# Patient Record
Sex: Male | Born: 1990
Health system: Southern US, Community
[De-identification: ages and names within clinical notes are randomized; demographics above are authoritative.]

## PROBLEM LIST (undated history)

## (undated) ENCOUNTER — Emergency Department (HOSPITAL_COMMUNITY): Admission: EM | Payer: Self-pay | Source: Home / Self Care

## (undated) DIAGNOSIS — I498 Other specified cardiac arrhythmias: Secondary | ICD-10-CM

## (undated) DIAGNOSIS — R9431 Abnormal electrocardiogram [ECG] [EKG]: Secondary | ICD-10-CM

## (undated) DIAGNOSIS — L309 Dermatitis, unspecified: Secondary | ICD-10-CM

## (undated) DIAGNOSIS — I2699 Other pulmonary embolism without acute cor pulmonale: Secondary | ICD-10-CM

## (undated) HISTORY — DX: Dermatitis, unspecified: L30.9

## (undated) HISTORY — PX: LACERATION REPAIR: SHX5168

## (undated) HISTORY — DX: Other specified cardiac arrhythmias: I49.8

## (undated) HISTORY — DX: Abnormal electrocardiogram (ECG) (EKG): R94.31

## (undated) HISTORY — DX: Other pulmonary embolism without acute cor pulmonale: I26.99

---

## 2016-12-23 ENCOUNTER — Emergency Department (HOSPITAL_COMMUNITY)
Admission: EM | Admit: 2016-12-23 | Discharge: 2016-12-24 | Disposition: A | Discharge status: Home / Self Care | Payer: Commercial Managed Care - PPO | Attending: Emergency Medicine | Admitting: Emergency Medicine

## 2016-12-23 ENCOUNTER — Other Ambulatory Visit: Payer: Self-pay

## 2016-12-23 ENCOUNTER — Emergency Department (HOSPITAL_COMMUNITY): Payer: Commercial Managed Care - PPO

## 2016-12-23 ENCOUNTER — Encounter (HOSPITAL_COMMUNITY): Payer: Self-pay | Admitting: Emergency Medicine

## 2016-12-23 DIAGNOSIS — R002 Palpitations: Secondary | ICD-10-CM | POA: Diagnosis not present

## 2016-12-23 DIAGNOSIS — R42 Dizziness and giddiness: Secondary | ICD-10-CM | POA: Insufficient documentation

## 2016-12-23 DIAGNOSIS — R9431 Abnormal electrocardiogram [ECG] [EKG]: Secondary | ICD-10-CM | POA: Diagnosis not present

## 2016-12-23 LAB — I-STAT TROPONIN, ED: Troponin i, poc: 0.02 ng/mL (ref 0.00–0.08)

## 2016-12-23 NOTE — ED Triage Notes (Signed)
Pt presents to ED for assessment after having a 25 minute cardio work out tonight, and then drinking creatine (workout supplement) and states he began to feel nauseous, sweaty, having palpitations.  Patient states most symptoms have resolved now but the palpitations.

## 2016-12-23 NOTE — ED Triage Notes (Signed)
Patient previous chart cancelled by accident.  Pt presents to ED for assessment of weakness, fatigue, light-headedness, nausea, and diaphoresis after a 25 minute work out.  Pt states he drank creatase (workout supplement).  Pt denies CP or SOB;  EKG done and shown to Dr. Fayrene Fearing, irregular.  CP protocol blood added.

## 2016-12-24 ENCOUNTER — Other Ambulatory Visit: Payer: Self-pay

## 2016-12-24 LAB — BASIC METABOLIC PANEL
ANION GAP: 8 (ref 5–15)
BUN: 12 mg/dL (ref 6–20)
CHLORIDE: 105 mmol/L (ref 101–111)
CO2: 26 mmol/L (ref 22–32)
Calcium: 9.7 mg/dL (ref 8.9–10.3)
Creatinine, Ser: 1.22 mg/dL (ref 0.61–1.24)
GFR calc Af Amer: >60 mL/min (ref >60)
GFR calc non Af Amer: >60 mL/min (ref >60)
GLUCOSE: 100 mg/dL — AB (ref 65–99)
POTASSIUM: 3.5 mmol/L (ref 3.5–5.1)
Sodium: 139 mmol/L (ref 135–145)

## 2016-12-24 LAB — CBC
HEMATOCRIT: 43 % (ref 39.0–52.0)
HEMOGLOBIN: 15.1 g/dL (ref 13.0–17.0)
MCH: 29.6 pg (ref 26.0–34.0)
MCHC: 35.1 g/dL (ref 30.0–36.0)
MCV: 84.3 fL (ref 78.0–100.0)
Platelets: 231 10*3/uL (ref 150–400)
RBC: 5.1 MIL/uL (ref 4.22–5.81)
RDW: 12.7 % (ref 11.5–15.5)
WBC: 6.6 10*3/uL (ref 4.0–10.5)

## 2016-12-24 LAB — I-STAT TROPONIN, ED: Troponin i, poc: 0.02 ng/mL (ref 0.00–0.08)

## 2016-12-24 NOTE — ED Provider Notes (Signed)
I was signed out patient as pending repeat troponin for evaluation of Wellens appearing EKG.  3hr troponin is negative as well.  EKG is unchanged x3.  Plan to DC home with close follow up with Dr. Eldridge DaceVaranasi in clinic. I explained this to the patient and he demonstrates good understanding. He appears well and in NAD. VS remain within his normal limits and he is safe for DC.   Tomasita Crumbleni, Macil Crady, MD 12/24/16 [phone]

## 2016-12-24 NOTE — ED Provider Notes (Signed)
[facility]-EMERGENCY DEPT Provider Note   CSN: [id number] Arrival date & time: 12/23/16  2203     History   Chief Complaint Chief Complaint  Patient presents with  . Weakness    HPI Philip Houston is a 26 y.o. male. CC: Felt funny after workout.  HPI:  26 year old male. He did a workout today with his girlfriend. This was at the gym. Some cardio and some lifting. He denies any symptoms while doing this. He drank creatine shake afterwards. No stimulants. He states he felt funny. He felt like his heart was pounding. He felt dizzy and lightheaded and nauseated. He did not have chest pain. He did not have shortness of breath. He became concerned with his symptoms. They were not improving this is girlfriend drove him here. After checking into triage and having an EKG he decided to leave as he was feeling better.  His EKG was abnormal and he was asked by the triage nurse to first him outside to come back for evaluation.  History reviewed. No pertinent past medical history.  There are no active problems to display for this patient.   History reviewed. No pertinent surgical history.     Home Medications    Prior to Admission medications   Not on File    Family History History reviewed. No pertinent family history.  Social History Social History  Substance Use Topics  . Smoking status: Never Smoker  . Smokeless tobacco: Never Used  . Alcohol use Yes     Comment: socially     Allergies   Sulfa antibiotics   Review of Systems Review of Systems  Constitutional: Negative for appetite change, chills, diaphoresis, fatigue and fever.  HENT: Negative for mouth sores, sore throat and trouble swallowing.   Eyes: Negative for visual disturbance.  Respiratory: Negative for cough, chest tightness, shortness of breath and wheezing.   Cardiovascular: Negative for chest pain.  Gastrointestinal: Negative for abdominal distention, abdominal pain, diarrhea, nausea and vomiting.  Endocrine:  Negative for polydipsia, polyphagia and polyuria.  Genitourinary: Negative for dysuria, frequency and hematuria.  Musculoskeletal: Negative for gait problem.  Skin: Negative for color change, pallor and rash.  Neurological: Positive for dizziness, weakness and light-headedness. Negative for syncope and headaches.  Hematological: Does not bruise/bleed easily.  Psychiatric/Behavioral: Negative for behavioral problems and confusion.     Physical Exam Updated Vital Signs BP 124/90 (BP Location: Right Arm)   Pulse 78   Temp 99 F (37.2 C) (Oral)   Resp 16   SpO2 98%   Physical Exam  Constitutional: He is oriented to person, place, and time. He appears well-developed and well-nourished. No distress.  HENT:  Head: Normocephalic.  Eyes: Pupils are equal, round, and reactive to light. Conjunctivae are normal. No scleral icterus.  Neck: Normal range of motion. Neck supple. No thyromegaly present.  Cardiovascular: Normal rate and regular rhythm.  Exam reveals no gallop and no friction rub.   No murmur heard. Pulmonary/Chest: Effort normal and breath sounds normal. No respiratory distress. He has no wheezes. He has no rales.  Abdominal: Soft. Bowel sounds are normal. He exhibits no distension. There is no tenderness. There is no rebound.  Musculoskeletal: Normal range of motion.  Neurological: He is alert and oriented to person, place, and time.  Skin: Skin is warm and dry. No rash noted.  Psychiatric: He has a normal mood and affect. His behavior is normal.     ED Treatments / Results  Labs (all labs ordered are listed, but  only abnormal results are displayed) Labs Reviewed  CBC  BASIC METABOLIC PANEL  I-STAT TROPONIN, ED    EKG  EKG Interpretation None       Radiology Dg Chest 2 View  Result Date: 12/23/2016 CLINICAL DATA:  Weakness and fatigue.  Diaphoresis tonight. EXAM: CHEST  2 VIEW COMPARISON:  None. FINDINGS: The lungs are clear. The pulmonary vasculature is normal.  Heart size is normal. Hilar and mediastinal contours are unremarkable. There is no pleural effusion. IMPRESSION: No active cardiopulmonary disease. Electronically Signed   By: Ellery Plunk M.D.   On: 12/23/2016 23:23    Procedures Procedures (including critical care time)  Medications Ordered in ED Medications - No data to display   Initial Impression / Assessment and Plan / ED Course  I have reviewed the triage vital signs and the nursing notes.  Pertinent labs & imaging results that were available during my care of the patient were reviewed by me and considered in my medical decision making (see chart for details).   EKG shows ST elevations V1 and V2. Diffuse ST depressions. He is in a sinus based rhythm. Initial concern was for anterior MI, or Wellen's syndrome. However, the patient is asymptomatic. States he feels "fine". I discussed the case with Dr.Varanosi.  With my description of a young patient with no history of structural heart disease or cardiac risk factors or stimulant use and no symptoms he felt this was likely explained by interventricular conduction delay. He agreed with serial enzymes and cardiac follow-up if asymptomatic, and normal enzymes.  Final Clinical Impressions(s) / ED Diagnoses   Final diagnoses:  Dizzy    New Prescriptions New Prescriptions   No medications on file     Rolland Porter, MD 12/24/16 [phone]

## 2016-12-24 NOTE — ED Notes (Signed)
Pt ambulated to the bathroom without difficulty.  

## 2016-12-31 ENCOUNTER — Telehealth: Payer: Self-pay | Admitting: *Deleted

## 2016-12-31 NOTE — Telephone Encounter (Signed)
Left message for EP scheduler to arrange a EP appoint for pt.

## 2017-01-11 ENCOUNTER — Ambulatory Visit (INDEPENDENT_AMBULATORY_CARE_PROVIDER_SITE_OTHER): Payer: Commercial Managed Care - PPO | Admitting: Internal Medicine

## 2017-01-11 ENCOUNTER — Encounter: Payer: Self-pay | Admitting: Internal Medicine

## 2017-01-11 VITALS — BP 126/84 | HR 74 | Ht 71.0 in | Wt 197.8 lb

## 2017-01-11 DIAGNOSIS — I498 Other specified cardiac arrhythmias: Secondary | ICD-10-CM | POA: Diagnosis not present

## 2017-01-11 NOTE — Patient Instructions (Signed)
Medication Instructions: - Your physician recommends that you continue on your current medications as directed. Please refer to the Current Medication list given to you today.  Labwork: - none ordered  Procedures/Testing: - Your physician has requested that you have an echocardiogram. Echocardiography is a painless test that uses sound waves to create images of your heart. It provides your doctor with information about the size and shape of your heart and how well your heart's chambers and valves are working. This procedure takes approximately one hour. There are no restrictions for this procedure.  Follow-Up: - Your physician wants you to follow-up in: 6 months with Dr. Graciela Husbands. You will receive a reminder letter in the mail two months in advance. If you don't receive a letter, please call our office to schedule the follow-up appointment.  Any Additional Special Instructions Will Be Listed Below (If Applicable). - Alive Cor monitor    If you need a refill on your cardiac medications before your next appointment, please call your pharmacy.

## 2017-01-11 NOTE — Progress Notes (Signed)
ELECTROPHYSIOLOGY CONSULT NOTE  Patient ID: Philip Houston, MRN: [id number], DOB/AGE: 28/06/1990 28 y.o. Admit date: (Not on file) Date of Consult: 01/11/2017  Primary Physician: Patient, No Pcp Per Primary Cardiologist: new     Philip Houston is a 26 y.o. male who is being seen today for the evaluation of abnormal ECG at the request of ER.    HPI Philip Houston is a 26 y.o. male referred for abnormal ECG  He was working out and afterwards, he took Creatine and developed palps and got LH.  He went to ER where ECG Brugada Type 1-- He continued to have palps, hard but not fast. They have resovled, having lasted about 4 days  No hx of syncope; no family hx of syncope.  A paternal uncle died unexpectedly at about 51 yo.   Past Medical History:  Diagnosis Date  . Abnormal ECG    Brugada Type 1 pattern      Surgical History: No past surgical history on file.   Home Meds: Prior to Admission medications   Not on File    Allergies:  Allergies  Allergen Reactions  . Sulfa Antibiotics     "when I was a kid"    Social History   Social History  . Marital status: Married    Spouse name: N/A  . Number of children: N/A  . Years of education: N/A   Occupational History  . Not on file.   Social History Main Topics  . Smoking status: Never Smoker  . Smokeless tobacco: Never Used  . Alcohol use Yes     Comment: socially  . Drug use: No  . Sexual activity: Not on file   Other Topics Concern  . Not on file   Social History Narrative  . No narrative on file     No family history on file.   ROS:  Please see the history of present illness.     All other systems reviewed and negative.    Physical Exam: Blood pressure 126/84, pulse 74, height  (1.803 m), weight 197 lb 12.8 oz (89.7 kg), SpO2 97 %. General: Well developed, well nourished male in no acute distress. Head: Normocephalic, atraumatic, sclera non-icteric, no xanthomas, nares are without discharge. EENT:  normal  Lymph Nodes:  none Neck: Negative for carotid bruits. JVD not elevated. Back:without scoliosis kyphosis Lungs: Clear bilaterally to auscultation without wheezes, rales, or rhonchi. Breathing is unlabored. Heart: RRR with S1 S2. No  murmur . No rubs, or gallops appreciated. Abdomen: Soft, non-tender, non-distended with normoactive bowel sounds. No hepatomegaly. No rebound/guarding. No obvious abdominal masses. Msk:  Strength and tone appear normal for age. Extremities: No clubbing or cyanosis. No edema.  Distal pedal pulses are 2+ and equal bilaterally. Skin: Warm and Dry Neuro: Alert and oriented X 3. CN III-XII intact Grossly normal sensory and motor function . Psych:  Responds to questions appropriately with a normal affect.      Labs: Cardiac Enzymes No results for input(s): CKTOTAL, CKMB, TROPONINI in the last 72 hours. CBC Lab Results  Component Value Date   WBC 6.6 12/23/2016   HGB 15.1 12/23/2016   HCT 43.0 12/23/2016   MCV 84.3 12/23/2016   PLT 231 12/23/2016   PROTIME: No results for input(s): LABPROT, INR in the last 72 hours. Chemistry No results for input(s): NA, K, CL, CO2, BUN, CREATININE, CALCIUM, PROT, BILITOT, ALKPHOS, ALT, AST, GLUCOSE in the last 168 hours.  Invalid input(s): LABALBU Lipids No  results found for: CHOL, HDL, LDLCALC, TRIG BNP No results found for: PROBNP Thyroid Function Tests: No results for input(s): TSH, T4TOTAL, T3FREE, THYROIDAB in the last 72 hours.  Invalid input(s): FREET3 Miscellaneous No results found for: DDIMER  Radiology/Studies:  Dg Chest 2 View  Result Date: 12/23/2016 CLINICAL DATA:  Weakness and fatigue.  Diaphoresis tonight. EXAM: CHEST  2 VIEW COMPARISON:  None. FINDINGS: The lungs are clear. The pulmonary vasculature is normal. Heart size is normal. Hilar and mediastinal contours are unremarkable. There is no pleural effusion. IMPRESSION: No active cardiopulmonary disease. Electronically Signed   By: Ellery Plunk M.D.   On: 12/23/2016 23:23    EKG: Sinus at 61 with a type I Brugada ECG   Assessment and Plan:  Brugada ECG  Palpitations   The patient has an asymptomatic Brugada ECG without serious symptoms i.e. Syncope or aborted cardiac arrest. There is no clear role for risk stratification in this patient cohort.  Imaging may be of value to identify phenocopies i.e. ARVC. We will undertake an echocardiogram   With his palpitations, if we were to find ventricular tachycardia further testing might be indicated. Hence I have recommended the use of an AliveCor monitor   I've also given recommendations regarding antipyretic therapy       Sherryl Manges

## 2017-01-20 ENCOUNTER — Ambulatory Visit: Payer: Commercial Managed Care - PPO | Admitting: Physician Assistant

## 2017-01-25 ENCOUNTER — Other Ambulatory Visit: Payer: Self-pay

## 2017-01-25 ENCOUNTER — Telehealth (HOSPITAL_COMMUNITY): Payer: Self-pay | Admitting: Internal Medicine

## 2017-01-25 ENCOUNTER — Ambulatory Visit (HOSPITAL_COMMUNITY): Payer: Commercial Managed Care - PPO | Attending: Internal Medicine

## 2017-01-25 DIAGNOSIS — I498 Other specified cardiac arrhythmias: Secondary | ICD-10-CM | POA: Diagnosis not present

## 2017-01-25 DIAGNOSIS — I420 Dilated cardiomyopathy: Secondary | ICD-10-CM | POA: Diagnosis not present

## 2017-01-25 DIAGNOSIS — I051 Rheumatic mitral insufficiency: Secondary | ICD-10-CM | POA: Insufficient documentation

## 2017-01-28 NOTE — Telephone Encounter (Signed)
User: Trina Ao A Date/time: 01/25/17 2:27 PM  Comment: Called pt and lmsg for him to CB to see if he wanted to come in sooner.   Context:  Outcome: Left Message  Phone number: [phone] Phone Type: Home Phone  Comm. type: Telephone Call type: Outgoing  Contact: Roena Malady Relation to patient: Self

## 2017-02-04 ENCOUNTER — Ambulatory Visit: Payer: Commercial Managed Care - PPO | Admitting: Interventional Cardiology

## 2017-02-08 ENCOUNTER — Telehealth: Payer: Self-pay

## 2017-02-08 NOTE — Telephone Encounter (Signed)
lmtcb for echo results.

## 2017-02-11 NOTE — Telephone Encounter (Signed)
Pt is aware and agreeable to normal echo results 

## 2018-07-18 ENCOUNTER — Telehealth: Payer: Self-pay | Admitting: Internal Medicine

## 2018-07-18 NOTE — Telephone Encounter (Signed)
  Patient needs to be cleared by his cardiologist to start his new job in [employer]. He is supposed to start August 15, 2018. Patient advised we were not making any new appts before August 08, 2018. Our first available appt was 08/16/18. Patient would like to see if he can be worked in for clearance.

## 2018-07-18 NOTE — Telephone Encounter (Signed)
Pt has agreed to have a telehealth visit with Dr Graciela Husbands on 3/25.

## 2018-07-18 NOTE — Telephone Encounter (Signed)
Scheduler informed pt may do a telehealth visit, otherwise he will need to wait until we have passed COVID-19.

## 2018-07-18 NOTE — Telephone Encounter (Signed)
New Message    Patient has appointment on 4/16 but states he starts his new job 4/20 so he would like to see if he could come in sooner.

## 2018-07-19 ENCOUNTER — Telehealth: Payer: Self-pay | Admitting: Internal Medicine

## 2018-07-19 ENCOUNTER — Encounter: Payer: Self-pay | Admitting: Internal Medicine

## 2018-07-19 NOTE — Telephone Encounter (Signed)
Clearance to work form placed in Dr. Odessa Fleming box for signature.

## 2018-07-20 ENCOUNTER — Other Ambulatory Visit: Payer: Self-pay

## 2018-07-20 ENCOUNTER — Telehealth (INDEPENDENT_AMBULATORY_CARE_PROVIDER_SITE_OTHER): Payer: Commercial Managed Care - PPO | Admitting: Internal Medicine

## 2018-07-20 VITALS — BP 130/84 | HR 58 | Ht 71.0 in | Wt 200.0 lb

## 2018-07-20 DIAGNOSIS — I498 Other specified cardiac arrhythmias: Secondary | ICD-10-CM | POA: Diagnosis not present

## 2018-07-20 NOTE — Progress Notes (Signed)
Virtual Visit via Video Note    Evaluation Performed:  Follow-up visit  This visit type was conducted due to national recommendations for restrictions regarding the COVID-19 Pandemic (e.g. social distancing).  This format is felt to be most appropriate for this patient at this time.  All issues noted in this document were discussed and addressed.  No physical exam was performed (except for noted visual exam findings with Video Visits).  Please refer to the patient's chart (MyChart message for video visits and phone note for telephone visits) for the patient's consent to telehealth for [facility].  Date:  07/20/2018   ID:  Philip Houston, DOB 1990-08-10, MRN [id number]  Patient Location:   [address] [place] [address]   Provider location:   [place]  PCP:  Patient, No Pcp Per  Cardiologist:  Graciela Husbands Electrophysiologist:  None   Chief Complaint:  Work release  History of Present Illness:    Philip Houston is a 28 y.o. male who presents via audio/video conferencing for a telehealth visit today.   He has a hx of significant lightheadedness and presyncope but no syncope  ER>> ECG  T1 Brugada tracing  .The patient denies chest pain, shortness of breath, nocturnal dyspnea, orthopnea or peripheral edema.  There have been no palpitations, lightheadedness or syncope.    The patient does not symptoms concerning for COVID-19 infection (fever, chills, cough, or new SHORTNESS OF BREATH).    Prior CV studies:   The following studies were reviewed today:  ECG  Past Medical History:  Diagnosis Date  . Abnormal ECG    Brugada Type 1 pattern   No past surgical history on file.   Current Meds  Medication Sig  . Multiple Vitamins-Minerals (MULTIVITAMIN ADULT) CHEW Chew by mouth.     Allergies:   Sulfa antibiotics   Social History   Tobacco Use  . Smoking status: Never Smoker  . Smokeless tobacco: Never Used  Substance Use Topics  . Alcohol use: Yes    Comment:  socially  . Drug use: No     Family Hx: The patient's family history is not on file.  ROS:   Please see the history of present illness.      All other systems reviewed and are negative.   Labs/Other Tests and Data Reviewed:    Recent Labs: No results found for requested labs within last 8760 hours.   Recent Lipid Panel No results found for: CHOL, TRIG, HDL, CHOLHDL, LDLCALC, LDLDIRECT  Wt Readings from Last 3 Encounters:  07/20/18 200 lb (90.7 kg)  01/11/17 197 lb 12.8 oz (89.7 kg)     Exam:    Vital Signs:  BP 130/84   Pulse (!) 58   Ht 5\' 11"  (1.803 m)   Wt 200 lb (90.7 kg)   BMI 27.89 kg/m    Well nourished, well developed male in no  acute distress.    ASSESSMENT & PLAN:    1.  BRUGADA T 1 ECG  The patient has a T1 Brugada ECG in the absence of symptoms to support a diagnosis of Brugada syndrome.  Hence recommendations are to be attentive to fever--with generous use of antipyretics and avoid other meds as listed at [facility] Or[facility]  No other restrictions are necessary  His form clearing him for full duty was signed     COVID-19 Education: The signs and symptoms of COVID-19 were discussed with the patient and how to seek care for testing (follow up with PCP or  arrange E-visit).   The importance of social distancing was discussed today.  Patient Risk:   After full review of this patients clinical status, I feel that they are at least moderate risk at this time.  Time:   Today, I have spent 18 minutes with the patient with telehealth technology discussing As above     Medication Adjustments/Labs and Tests Ordered: Current medicines are reviewed at length with the patient today.  Concerns regarding medicines are outlined above.  Tests Ordered: No orders of the defined types were placed in this encounter.  Medication Changes: No orders of the defined types were placed in this encounter.   Disposition: prn Signed, Sherryl Manges, MD  07/20/2018 1:01  PM    [facility]

## 2018-07-20 NOTE — Patient Instructions (Addendum)
Medication Instructions:  Your physician recommends that you continue on your current medications as directed. Please refer to the Current Medication list given to you today.  Labwork: None ordered.  Testing/Procedures: None ordered.  Follow-Up: Your physician recommends that you schedule a follow-up appointment as needed with Dr Klein  Any Other Special Instructions Will Be Listed Below (If Applicable).     If you need a refill on your cardiac medications before your next appointment, please call your pharmacy.  

## 2018-08-11 ENCOUNTER — Ambulatory Visit: Payer: Commercial Managed Care - PPO | Admitting: Internal Medicine

## 2018-10-13 ENCOUNTER — Ambulatory Visit: Payer: Self-pay | Admitting: Physician Assistant

## 2018-11-17 ENCOUNTER — Ambulatory Visit: Payer: Self-pay | Admitting: Physician Assistant

## 2019-01-26 ENCOUNTER — Encounter: Payer: Self-pay | Admitting: Internal Medicine

## 2019-01-26 ENCOUNTER — Ambulatory Visit (INDEPENDENT_AMBULATORY_CARE_PROVIDER_SITE_OTHER): Payer: BC Managed Care – PPO | Admitting: Internal Medicine

## 2019-01-26 ENCOUNTER — Other Ambulatory Visit: Payer: Self-pay

## 2019-01-26 VITALS — Ht 71.0 in | Wt 195.0 lb

## 2019-01-26 DIAGNOSIS — E559 Vitamin D deficiency, unspecified: Secondary | ICD-10-CM

## 2019-01-26 DIAGNOSIS — Z0000 Encounter for general adult medical examination without abnormal findings: Secondary | ICD-10-CM | POA: Insufficient documentation

## 2019-01-26 DIAGNOSIS — I498 Other specified cardiac arrhythmias: Secondary | ICD-10-CM | POA: Diagnosis not present

## 2019-01-26 DIAGNOSIS — Z13220 Encounter for screening for lipoid disorders: Secondary | ICD-10-CM | POA: Diagnosis not present

## 2019-01-26 DIAGNOSIS — Z1329 Encounter for screening for other suspected endocrine disorder: Secondary | ICD-10-CM

## 2019-01-26 DIAGNOSIS — Z1389 Encounter for screening for other disorder: Secondary | ICD-10-CM

## 2019-01-26 DIAGNOSIS — R9431 Abnormal electrocardiogram [ECG] [EKG]: Secondary | ICD-10-CM | POA: Diagnosis not present

## 2019-01-26 DIAGNOSIS — L309 Dermatitis, unspecified: Secondary | ICD-10-CM

## 2019-01-26 MED ORDER — TRIAMCINOLONE ACETONIDE 0.1 % EX OINT: 1.0000 "application " | TOPICAL_OINTMENT | Freq: Two times a day (BID) | CUTANEOUS | 12 refills | Status: DC

## 2019-01-26 NOTE — Progress Notes (Signed)
Virtual Visit via Video Note  I connected with Philip Houston   on 01/26/19 at  9:10 AM EDT by a video enabled telemedicine application and verified that I am speaking with the correct person using two identifiers.  Location patient: home Location provider:work or home office Persons participating in the virtual visit: patient, provider  I discussed the limitations of evaluation and management by telemedicine and the availability of in person appointments. The patient expressed understanding and agreed to proceed.   HPI: 1. Annual needs PCP has never seen one no issues other than eczema left inner ankle x years and left index finger   2. H/o brugada denies lightheadedness, CP, sob, irregular heart beat or flutter but this is how it presented in 01/2017    ROS: See pertinent positives and negatives per HPI. General: wt stable HEENT: no sore throat  CV: no chest pain  Lungs: no sob  GI: no ab pain  GU: no issues MSK: no issues Neuro: no h/a  Skin: +eczema  Psych: no anxiety/depression   Past Medical History:  Diagnosis Date  . Abnormal ECG    Brugada Type 1 pattern  . Brugada syndrome    01/2017 presented after working out with lightheadedness, heart flutter and irregular HB  . Eczema     Past Surgical History:  Procedure Laterality Date  . LACERATION REPAIR     right thumb 28 y.o     Family History  Problem Relation Age of Onset  . Diabetes Father     SOCIAL HX:  Married  Emergency planning/management officer     Current Outpatient Medications:  Marland Kitchen  Multiple Vitamins-Minerals (MULTIVITAMIN ADULT) CHEW, Chew by mouth., Disp: , Rfl:  .  triamcinolone ointment (KENALOG) 0.1 %, Apply 1 application topically 2 (two) times daily. Prn eczema left ankle and finger, Disp: 80 g, Rfl: 12  EXAM:  VITALS per patient if applicable:  GENERAL: alert, oriented, appears well and in no acute distress  HEENT: atraumatic, conjunttiva clear, no obvious abnormalities on inspection of external nose and  ears  NECK: normal movements of the head and neck  LUNGS: on inspection no signs of respiratory distress, breathing rate appears normal, no obvious gross SOB, gasping or wheezing  CV: no obvious cyanosis  MS: moves all visible extremities without noticeable abnormality  PSYCH/NEURO: pleasant and cooperative, no obvious depression or anxiety, speech and thought processing grossly intact  ASSESSMENT AND PLAN:  Discussed the following assessment and plan:  Annual physical exam -  sch fasting labs [facility] 02/03/19  Flu shot declines  Tdap per pt had in 2019  Hep B immune per pt   rec healthy diet and exercise   Eczema left inner ankle and L index finger, unspecified type - Plan: triamcinolone ointment (KENALOG) 0.1 % Rec dove soap and moisturize using coconut oil   Brugada syndrome -saw cards 06/2018 at least f/u 1x per year Dr. Graciela Husbands   -we discussed possible serious and likely etiologies, options for evaluation and workup, limitations of telemedicine visit vs in person visit, treatment, treatment risks and precautions. Pt prefers to treat via telemedicine empirically rather then risking or undertaking an in person visit at this moment. Patient agrees to seek prompt in person care if worsening, new symptoms arise, or if is not improving with treatment.   I discussed the assessment and treatment plan with the patient. The patient was provided an opportunity to ask questions and all were answered. The patient agreed with the plan and demonstrated an understanding  of the instructions.   The patient was advised to call back or seek an in-person evaluation if the symptoms worsen or if the condition fails to improve as anticipated.  Time spent 30 minutes  Delorise Jackson, MD

## 2019-01-26 NOTE — Patient Instructions (Signed)
[facility] [address] or [address]  Fasting labs 02/03/2019 no food only water x 8-12 hours   Exercising to Lose Weight Exercise is structured, repetitive physical activity to improve fitness and health. Getting regular exercise is important for everyone. It is especially important if you are overweight. Being overweight increases your risk of heart disease, stroke, diabetes, high blood pressure, and several types of cancer. Reducing your calorie intake and exercising can help you lose weight. Exercise is usually categorized as moderate or vigorous intensity. To lose weight, most people need to do a certain amount of moderate-intensity or vigorous-intensity exercise each week. Moderate-intensity exercise  Moderate-intensity exercise is any activity that gets you moving enough to burn at least three times more energy (calories) than if you were sitting. Examples of moderate exercise include:  Walking a mile in 15 minutes.  Doing light yard work.  Biking at an easy pace. Most people should get at least 150 minutes (2 hours and 30 minutes) a week of moderate-intensity exercise to maintain their body weight. Vigorous-intensity exercise Vigorous-intensity exercise is any activity that gets you moving enough to burn at least six times more calories than if you were sitting. When you exercise at this intensity, you should be working hard enough that you are not able to carry on a conversation. Examples of vigorous exercise include:  Running.  Playing a team sport, such as football, basketball, and soccer.  Jumping rope. Most people should get at least 75 minutes (1 hour and 15 minutes) a week of vigorous-intensity exercise to maintain their body weight. How can exercise affect me? When you exercise enough to burn more calories than you eat, you lose weight. Exercise also reduces body fat and builds muscle. The more muscle you have, the more calories you burn. Exercise also:   Improves mood.  Reduces stress and tension.  Improves your overall fitness, flexibility, and endurance.  Increases bone strength. The amount of exercise you need to lose weight depends on:  Your age.  The type of exercise.  Any health conditions you have.  Your overall physical ability. Talk to your health care provider about how much exercise you need and what types of activities are safe for you. What actions can I take to lose weight? Nutrition   Make changes to your diet as told by your health care provider or diet and nutrition specialist (dietitian). This may include: ? Eating fewer calories. ? Eating more protein. ? Eating less unhealthy fats. ? Eating a diet that includes fresh fruits and vegetables, whole grains, low-fat dairy products, and lean protein. ? Avoiding foods with added fat, salt, and sugar.  Drink plenty of water while you exercise to prevent dehydration or heat stroke. Activity  Choose an activity that you enjoy and set realistic goals. Your health care provider can help you make an exercise plan that works for you.  Exercise at a moderate or vigorous intensity most days of the week. ? The intensity of exercise may vary from person to person. You can tell how intense a workout is for you by paying attention to your breathing and heartbeat. Most people will notice their breathing and heartbeat get faster with more intense exercise.  Do resistance training twice each week, such as: ? Push-ups. ? Sit-ups. ? Lifting weights. ? Using resistance bands.  Getting short amounts of exercise can be just as helpful as long structured periods of exercise. If you have trouble finding time to exercise, try to include  exercise in your daily routine. ? Get up, stretch, and walk around every 30 minutes throughout the day. ? Go for a walk during your lunch break. ? Park your car farther away from your destination. ? If you take public transportation, get off one stop  early and walk the rest of the way. ? Make phone calls while standing up and walking around. ? Take the stairs instead of elevators or escalators.  Wear comfortable clothes and shoes with good support.  Do not exercise so much that you hurt yourself, feel dizzy, or get very short of breath. Where to find more information  U.S. Department of Health and Human Services: [employer].at  Centers for Disease Control and Prevention (CDC): http://www.wolf.info/ Contact a health care provider:  Before starting a new exercise program.  If you have questions or concerns about your weight.  If you have a medical problem that keeps you from exercising. Get help right away if you have any of the following while exercising:  Injury.  Dizziness.  Difficulty breathing or shortness of breath that does not go away when you stop exercising.  Chest pain.  Rapid heartbeat. Summary  Being overweight increases your risk of heart disease, stroke, diabetes, high blood pressure, and several types of cancer.  Losing weight happens when you burn more calories than you eat.  Reducing the amount of calories you eat in addition to getting regular moderate or vigorous exercise each week helps you lose weight. This information is not intended to replace advice given to you by your health care provider. Make sure you discuss any questions you have with your health care provider. Document Released: 05/16/2010 Document Revised: 04/26/2017 Document Reviewed: 04/26/2017 Elsevier Patient Education  2020 [employer].

## 2019-09-18 ENCOUNTER — Encounter: Payer: Self-pay | Admitting: Emergency Medicine

## 2019-09-18 ENCOUNTER — Other Ambulatory Visit: Payer: Self-pay

## 2019-09-18 ENCOUNTER — Emergency Department: Payer: BC Managed Care – PPO

## 2019-09-18 ENCOUNTER — Inpatient Hospital Stay
Admission: EM | Admit: 2019-09-18 | Discharge: 2019-09-22 | DRG: 871 | Disposition: A | Discharge status: Home / Self Care | Payer: BC Managed Care – PPO | Attending: Internal Medicine | Admitting: Internal Medicine

## 2019-09-18 DIAGNOSIS — E872 Acidosis: Secondary | ICD-10-CM | POA: Diagnosis present

## 2019-09-18 DIAGNOSIS — R600 Localized edema: Secondary | ICD-10-CM | POA: Diagnosis not present

## 2019-09-18 DIAGNOSIS — Z20822 Contact with and (suspected) exposure to covid-19: Secondary | ICD-10-CM | POA: Diagnosis present

## 2019-09-18 DIAGNOSIS — I2601 Septic pulmonary embolism with acute cor pulmonale: Secondary | ICD-10-CM | POA: Diagnosis not present

## 2019-09-18 DIAGNOSIS — I498 Other specified cardiac arrhythmias: Secondary | ICD-10-CM | POA: Diagnosis not present

## 2019-09-18 DIAGNOSIS — J90 Pleural effusion, not elsewhere classified: Secondary | ICD-10-CM | POA: Diagnosis present

## 2019-09-18 DIAGNOSIS — A419 Sepsis, unspecified organism: Principal | ICD-10-CM | POA: Diagnosis present

## 2019-09-18 DIAGNOSIS — Z7901 Long term (current) use of anticoagulants: Secondary | ICD-10-CM | POA: Diagnosis not present

## 2019-09-18 DIAGNOSIS — R0602 Shortness of breath: Secondary | ICD-10-CM | POA: Diagnosis not present

## 2019-09-18 DIAGNOSIS — R079 Chest pain, unspecified: Secondary | ICD-10-CM | POA: Diagnosis not present

## 2019-09-18 DIAGNOSIS — I2690 Septic pulmonary embolism without acute cor pulmonale: Secondary | ICD-10-CM | POA: Diagnosis not present

## 2019-09-18 DIAGNOSIS — I2693 Single subsegmental pulmonary embolism without acute cor pulmonale: Secondary | ICD-10-CM | POA: Diagnosis present

## 2019-09-18 DIAGNOSIS — J189 Pneumonia, unspecified organism: Secondary | ICD-10-CM | POA: Diagnosis present

## 2019-09-18 DIAGNOSIS — I2699 Other pulmonary embolism without acute cor pulmonale: Secondary | ICD-10-CM

## 2019-09-18 DIAGNOSIS — I2602 Saddle embolus of pulmonary artery with acute cor pulmonale: Secondary | ICD-10-CM | POA: Diagnosis not present

## 2019-09-18 LAB — CBC
HCT: 45 % (ref 39.0–52.0)
Hemoglobin: 15.9 g/dL (ref 13.0–17.0)
MCH: 29.6 pg (ref 26.0–34.0)
MCHC: 35.3 g/dL (ref 30.0–36.0)
MCV: 83.6 fL (ref 80.0–100.0)
Platelets: 313 10*3/uL (ref 150–400)
RBC: 5.38 MIL/uL (ref 4.22–5.81)
RDW: 12.2 % (ref 11.5–15.5)
WBC: 12.3 10*3/uL — ABNORMAL HIGH (ref 4.0–10.5)
nRBC: 0 % (ref 0.0–0.2)

## 2019-09-18 LAB — COMPREHENSIVE METABOLIC PANEL
ALT: 45 U/L — ABNORMAL HIGH (ref 0–44)
AST: 23 U/L (ref 15–41)
Albumin: 4.9 g/dL (ref 3.5–5.0)
Alkaline Phosphatase: 84 U/L (ref 38–126)
Anion gap: 9 (ref 5–15)
BUN: 10 mg/dL (ref 6–20)
CO2: 26 mmol/L (ref 22–32)
Calcium: 9.4 mg/dL (ref 8.9–10.3)
Chloride: 103 mmol/L (ref 98–111)
Creatinine, Ser: 1.08 mg/dL (ref 0.61–1.24)
GFR calc Af Amer: >60 mL/min (ref >60)
GFR calc non Af Amer: >60 mL/min (ref >60)
Glucose, Bld: 151 mg/dL — ABNORMAL HIGH (ref 70–99)
Potassium: 3.7 mmol/L (ref 3.5–5.1)
Sodium: 138 mmol/L (ref 135–145)
Total Bilirubin: 1.3 mg/dL — ABNORMAL HIGH (ref 0.3–1.2)
Total Protein: 8.6 g/dL — ABNORMAL HIGH (ref 6.5–8.1)

## 2019-09-18 LAB — STREP PNEUMONIAE URINARY ANTIGEN: Strep Pneumo Urinary Antigen: NEGATIVE

## 2019-09-18 LAB — LACTIC ACID, PLASMA
Lactic Acid, Venous: 1 mmol/L (ref 0.5–1.9)
Lactic Acid, Venous: 2.3 mmol/L (ref 0.5–1.9)

## 2019-09-18 LAB — HIV ANTIBODY (ROUTINE TESTING W REFLEX): HIV Screen 4th Generation wRfx: NONREACTIVE

## 2019-09-18 LAB — SARS CORONAVIRUS 2 BY RT PCR (HOSPITAL ORDER, PERFORMED IN ~~LOC~~ HOSPITAL LAB): SARS Coronavirus 2: NEGATIVE

## 2019-09-18 LAB — MAGNESIUM: Magnesium: 2.1 mg/dL (ref 1.7–2.4)

## 2019-09-18 LAB — TROPONIN I (HIGH SENSITIVITY)
Troponin I (High Sensitivity): <2 ng/L (ref <18)
Troponin I (High Sensitivity): 3 ng/L (ref <18)

## 2019-09-18 LAB — POC SARS CORONAVIRUS 2 AG: SARS Coronavirus 2 Ag: NEGATIVE

## 2019-09-18 MED ORDER — SODIUM CHLORIDE 0.9 % IV SOLN
500.0000 mg | Freq: Once | INTRAVENOUS | Status: AC
  Administered 2019-09-18: 500 mg via INTRAVENOUS
  Filled 2019-09-18: qty 500

## 2019-09-18 MED ORDER — SODIUM CHLORIDE 0.9 % IV SOLN: INTRAVENOUS | Status: DC

## 2019-09-18 MED ORDER — DM-GUAIFENESIN ER 30-600 MG PO TB12
1.0000 | ORAL_TABLET | Freq: Two times a day (BID) | ORAL | Status: DC
  Administered 2019-09-18 – 2019-09-22 (×9): 1 via ORAL
  Filled 2019-09-18 (×9): qty 1

## 2019-09-18 MED ORDER — SODIUM CHLORIDE 0.9 % IV BOLUS
1500.0000 mL | Freq: Once | INTRAVENOUS | Status: AC
  Administered 2019-09-18: 500 mL via INTRAVENOUS

## 2019-09-18 MED ORDER — SODIUM CHLORIDE 0.9 % IV SOLN
1.0000 g | Freq: Once | INTRAVENOUS | Status: AC
  Administered 2019-09-18: 1 g via INTRAVENOUS
  Filled 2019-09-18: qty 10

## 2019-09-18 MED ORDER — SODIUM CHLORIDE 0.9 % IV SOLN
1.0000 g | INTRAVENOUS | Status: DC
  Administered 2019-09-19 – 2019-09-21 (×3): 1 g via INTRAVENOUS
  Filled 2019-09-18: qty 1
  Filled 2019-09-18: qty 10
  Filled 2019-09-18: qty 1
  Filled 2019-09-18: qty 10

## 2019-09-18 MED ORDER — ONDANSETRON HCL 4 MG/2ML IJ SOLN
4.0000 mg | Freq: Once | INTRAMUSCULAR | Status: AC
  Administered 2019-09-18: 4 mg via INTRAVENOUS
  Filled 2019-09-18: qty 2

## 2019-09-18 MED ORDER — OXYCODONE-ACETAMINOPHEN 5-325 MG PO TABS
1.0000 | ORAL_TABLET | ORAL | Status: DC | PRN
  Administered 2019-09-18 – 2019-09-21 (×5): 1 via ORAL
  Filled 2019-09-18 (×7): qty 1

## 2019-09-18 MED ORDER — MORPHINE SULFATE (PF) 2 MG/ML IV SOLN
2.0000 mg | INTRAVENOUS | Status: DC | PRN
  Administered 2019-09-18 – 2019-09-20 (×4): 2 mg via INTRAVENOUS
  Filled 2019-09-18 (×4): qty 1

## 2019-09-18 MED ORDER — ENOXAPARIN SODIUM 40 MG/0.4ML ~~LOC~~ SOLN
40.0000 mg | SUBCUTANEOUS | Status: DC
  Administered 2019-09-18 – 2019-09-19 (×2): 40 mg via SUBCUTANEOUS
  Filled 2019-09-18 (×2): qty 0.4

## 2019-09-18 MED ORDER — ACETAMINOPHEN 325 MG PO TABS
650.0000 mg | ORAL_TABLET | Freq: Four times a day (QID) | ORAL | Status: DC | PRN
  Administered 2019-09-18 – 2019-09-21 (×6): 650 mg via ORAL
  Filled 2019-09-18 (×7): qty 2

## 2019-09-18 MED ORDER — SODIUM CHLORIDE 0.9 % IV SOLN
100.0000 mg | Freq: Two times a day (BID) | INTRAVENOUS | Status: DC
  Administered 2019-09-19 – 2019-09-20 (×3): 100 mg via INTRAVENOUS
  Filled 2019-09-18 (×4): qty 100

## 2019-09-18 MED ORDER — MORPHINE SULFATE (PF) 4 MG/ML IV SOLN
4.0000 mg | Freq: Once | INTRAVENOUS | Status: AC
Start: 2019-09-18 — End: 2019-09-18
  Administered 2019-09-18: 4 mg via INTRAVENOUS
  Filled 2019-09-18: qty 1

## 2019-09-18 MED ORDER — SODIUM CHLORIDE 0.9 % IV SOLN
1000.0000 mL | Freq: Once | INTRAVENOUS | Status: AC
  Administered 2019-09-18: 1000 mL via INTRAVENOUS

## 2019-09-18 MED ORDER — ALBUTEROL SULFATE HFA 108 (90 BASE) MCG/ACT IN AERS
2.0000 | INHALATION_SPRAY | RESPIRATORY_TRACT | Status: DC | PRN
  Filled 2019-09-18: qty 6.7

## 2019-09-18 NOTE — H&P (Signed)
History and Physical    Philip Houston ZDG:[id number] DOB: 30-Apr-1990 DOA: 09/18/2019  Referring MD/NP/PA:   PCP: McLean-Scocuzza, Pasty Spillers, MD   Patient coming from:  The patient is coming from home.  At baseline, pt is independent for most of ADL.        Chief Complaint: SOB and chest pain  HPI: Philip Houston is a 29 y.o. male with medical history significant of Brugada syndrome type I, who presents with shortness of breath and chest pain  Patient symptoms started yesterday, including shortness of breath, cough, right-sided chest pain, fever and chills.  Patient had fever of 100.4 at home.  The chest pain is located in the right side of h is chest, pleuritic, sharp, moderate, nonradiating.  Patient does not have nausea vomiting, diarrhea, abdominal pain, symptoms of UTI or unilateral weakness.  ED Course: pt was found to have WBC 12.3, lactic acid 1.0, negative Covid Ag test, pending COVID-19 PCR, electrolytes renal function okay, magnesium 2.7, temperature 99.2, blood pressure 132/84, heart rate 93, RR 20, oxygen saturation 97% on room air.  Chest x-ray showed right base infiltration.  Patient is placed on progressive bed for observation.  Review of Systems:   General: has fevers, chills, no body weight gain, has fatigue HEENT: no blurry vision, hearing changes or sore throat Respiratory: has dyspnea, coughing, no wheezing CV: has chest pain, no palpitations GI: no nausea, vomiting, abdominal pain, diarrhea, constipation GU: no dysuria, burning on urination, increased urinary frequency, hematuria  Ext: no leg edema Neuro: no unilateral weakness, numbness, or tingling, no vision change or hearing loss Skin: no rash, no skin tear. MSK: No muscle spasm, no deformity, no limitation of range of movement in spin Heme: No easy bruising.  Travel history: No recent long distant travel.  Allergy:  Allergies  Allergen Reactions  . Sulfa Antibiotics     "when I was a kid" rash     Past  Medical History:  Diagnosis Date  . Abnormal ECG    Brugada Type 1 pattern  . Brugada syndrome    01/2017 presented after working out with lightheadedness, heart flutter and irregular HB  . Eczema     Past Surgical History:  Procedure Laterality Date  . LACERATION REPAIR     right thumb 29 y.o     Social History:  reports that he has never smoked. He has never used smokeless tobacco. He reports current alcohol use. He reports that he does not use drugs.  Family History:  Family History  Problem Relation Age of Onset  . Diabetes Father      Prior to Admission medications   Medication Sig Start Date End Date Taking? Authorizing Provider  Multiple Vitamins-Minerals (MULTIVITAMIN ADULT) CHEW Chew by mouth.    [provider]  triamcinolone ointment (KENALOG) 0.1 % Apply 1 application topically 2 (two) times daily. Prn eczema left ankle and finger 01/26/19   McLean-Scocuzza, Pasty Spillers, MD    Physical Exam: Vitals:   09/18/19 0900 09/18/19 0901 09/18/19 0902 09/18/19 0930  BP: 136/86   139/85  Pulse: 89 91 96 98  Resp: (!) 27 (!) 31 (!) 52 (!) 31  Temp:      TempSrc:      SpO2: 97% 98% 96% 97%  Weight:      Height:       General: Not in acute distress HEENT:       Eyes: PERRL, EOMI, no scleral icterus.       ENT: No  discharge from the ears and nose, no pharynx injection, no tonsillar enlargement.        Neck: No JVD, no bruit, no mass felt. Heme: No neck lymph node enlargement. Cardiac: S1/S2, RRR, No murmurs, No gallops or rubs. Respiratory:  No rales, wheezing, rhonchi or rubs. GI: Soft, nondistended, nontender, no rebound pain, no organomegaly, BS present. GU: No hematuria Ext: No pitting leg edema bilaterally. 2+DP/PT pulse bilaterally. Musculoskeletal: No joint deformities, No joint redness or warmth, no limitation of ROM in spin. Skin: No rashes.  Neuro: Alert, oriented X3, cranial nerves II-XII grossly intact, moves all extremities normally. Psych:  Patient is not psychotic, no suicidal or hemocidal ideation.  Labs on Admission: I have personally reviewed following labs and imaging studies  CBC: Recent Labs  Lab 09/18/19 0728  WBC 12.3*  HGB 15.9  HCT 45.0  MCV 83.6  PLT 774   Basic Metabolic Panel: Recent Labs  Lab 09/18/19 0728  NA 138  K 3.7  CL 103  CO2 26  GLUCOSE 151*  BUN 10  CREATININE 1.08  CALCIUM 9.4  MG 2.1   GFR: Estimated Creatinine Clearance: 104.3 mL/min (by C-G formula based on SCr of 1.08 mg/dL). Liver Function Tests: Recent Labs  Lab 09/18/19 0728  AST 23  ALT 45*  ALKPHOS 84  BILITOT 1.3*  PROT 8.6*  ALBUMIN 4.9   No results for input(s): LIPASE, AMYLASE in the last 168 hours. No results for input(s): AMMONIA in the last 168 hours. Coagulation Profile: No results for input(s): INR, PROTIME in the last 168 hours. Cardiac Enzymes: No results for input(s): CKTOTAL, CKMB, CKMBINDEX, TROPONINI in the last 168 hours. BNP (last 3 results) No results for input(s): PROBNP in the last 8760 hours. HbA1C: No results for input(s): HGBA1C in the last 72 hours. CBG: No results for input(s): GLUCAP in the last 168 hours. Lipid Profile: No results for input(s): CHOL, HDL, LDLCALC, TRIG, CHOLHDL, LDLDIRECT in the last 72 hours. Thyroid Function Tests: No results for input(s): TSH, T4TOTAL, FREET4, T3FREE, THYROIDAB in the last 72 hours. Anemia Panel: No results for input(s): VITAMINB12, FOLATE, FERRITIN, TIBC, IRON, RETICCTPCT in the last 72 hours. Urine analysis: No results found for: COLORURINE, APPEARANCEUR, LABSPEC, PHURINE, GLUCOSEU, HGBUR, BILIRUBINUR, KETONESUR, PROTEINUR, UROBILINOGEN, NITRITE, LEUKOCYTESUR Sepsis Labs: @LABRCNTIP (procalcitonin:4,lacticidven:4) )No results found for this or any previous visit (from the past 240 hour(s)).   Radiological Exams on Admission: DG Chest Portable 1 View  Result Date: 09/18/2019 CLINICAL DATA:  Shortness of breath and fever EXAM: PORTABLE  CHEST 1 VIEW COMPARISON:  December 23, 2016 FINDINGS: There is ill-defined airspace opacity in the right base with questionable small right pleural effusion. Lungs elsewhere clear. Heart size and pulmonary vascularity are normal. No adenopathy. No bone lesions. IMPRESSION: Apparent pneumonia right base with questionable small right pleural effusion. Lungs elsewhere clear. Cardiac silhouette within normal limits. No adenopathy. Electronically Signed   By: Lowella Grip III M.D.   On: 09/18/2019 07:55     EKG: Independently reviewed.  Brugada syndrome type I, LAD, QTC 447  Assessment/Plan Principal Problem:   CAP (community acquired pneumonia) Active Problems:   Brugada syndrome   CAP (community acquired pneumonia): Chest x-ray showed right base infiltration.  Lactic acid normal at 1.0.  Has leukocytosis 12.3.  Temperature 99.2.  Currently hemodynamically stable.  -Please to progressive unit for observation - IV Rocephin and doxycycline (patient received 1 dose of azithromycin in ED, will not continue azithromycin to avoid QT prolongation side effects) - Mucinex  for cough  - prn Albuterol  - Urine legionella and S. pneumococcal antigen - Follow up blood culture x2, sputum culture  - IVF: 2.5L of NS bolus in ED, followed by 75 mL per hour of NS   Hx of Brugada syndrome: pt is following up with cardiologist, Dr. Graciela Husbands -Cardiac monitor -Careful use of medications -avoid using QT prolonging meds now.     DVT ppx: sQ Lovenox Code Status: Full code Family Communication: Yes, patient's wife at bed side Disposition Plan:  Anticipate discharge back to previous environment Consults called:  none Admission status:   progressive unit for obs        Status is: Observation  The patient remains OBS appropriate and will d/c before 2 midnights.  Dispo: The patient is from: Home              Anticipated d/c is to: Home              Anticipated d/c date is: 1 day              Patient  currently is not medically stable to d/c.           Date of Service 09/18/2019    Lorretta Harp [facility]   If 7PM-7AM, please contact night-coverage [email or web address] 09/18/2019, 10:10 AM

## 2019-09-18 NOTE — ED Notes (Signed)
Rainbow drawn and sent to lab. °

## 2019-09-18 NOTE — ED Notes (Signed)
Pt and visitor given warm blanket per request.

## 2019-09-18 NOTE — Plan of Care (Signed)

## 2019-09-18 NOTE — ED Notes (Signed)
Pt sitting in bed speaking with this RN in NAD, visitor at bedside. PT states he is not having any pain at this time, advised pt to let me know if his pain returns. Visitor given water per request. Pt voices no needs at this time.

## 2019-09-18 NOTE — ED Notes (Signed)
Blood cultures collected and sent to lab prior to antibiotic start

## 2019-09-18 NOTE — ED Provider Notes (Signed)
[facility] Emergency Department Provider Note   ____________________________________________    I have reviewed the triage vital signs and the nursing notes.   HISTORY  Chief Complaint Shortness of Breath     HPI Philip Houston is a 29 y.o. male with a history of Brugada syndrome who presents with complaints of shortness of breath and right-sided chest pain. Patient describes yesterday he started having pain in his shoulder followed by pain in the right side of his chest and upper abdomen with worsening shortness of breath. Did have chills yesterday, temperature of 100.4. 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 Has never had this before. No calf pain or swelling. No history of DVT.   Past Medical History:  Diagnosis Date  . Abnormal ECG    Brugada Type 1 pattern  . Brugada syndrome    01/2017 presented after working out with lightheadedness, heart flutter and irregular HB  . Eczema     Patient Active Problem List   Diagnosis Date Noted  . CAP (community acquired pneumonia) 09/18/2019  . Annual physical exam 01/26/2019  . Eczema 01/26/2019  . Brugada syndrome   . Abnormal ECG     Past Surgical History:  Procedure Laterality Date  . LACERATION REPAIR     right thumb 29 y.o     Prior to Admission medications   Medication Sig Start Date End Date Taking? Authorizing Provider  Multiple Vitamins-Minerals (MULTIVITAMIN ADULT) CHEW Chew by mouth.    [provider]  triamcinolone ointment (KENALOG) 0.1 % Apply 1 application topically 2 (two) times daily. Prn eczema left ankle and finger 01/26/19   McLean-Scocuzza, Nino Glow, MD     Allergies Sulfa antibiotics  Family History  Problem Relation Age of Onset  . Diabetes Father     Social History Social History   Tobacco Use  . Smoking status: Never Smoker  . Smokeless tobacco: Never Used  Substance Use Topics  . Alcohol use: Yes    Comment: socially   . Drug use: No    Review of Systems  Constitutional: No fever/chills Eyes: No visual changes.  ENT: No sore throat. Cardiovascular: As above Respiratory: As above Gastrointestinal: No abdominal pain.  No nausea, no vomiting.   Genitourinary: Negative for dysuria. Musculoskeletal: Negative for back pain. Skin: Negative for rash. Neurological: Negative for headaches    ____________________________________________   PHYSICAL EXAM:  VITAL SIGNS: ED Triage Vitals  Enc Vitals Group     BP 09/18/19 0721 132/84     Pulse Rate 09/18/19 0721 97     Resp 09/18/19 0721 20     Temp 09/18/19 0721 99.2 F (37.3 C)     Temp Source 09/18/19 0721 Oral     SpO2 09/18/19 0721 97 %     Weight 09/18/19 0722 88.5 kg (195 lb)     Height 09/18/19 0722 1.676 m (5\' 6" )     Head Circumference --      Peak Flow --      Pain Score 09/18/19 0721 10     Pain Loc --      Pain Edu? --      Excl. in [place]? --     Constitutional: Alert and oriented.  Nose: No congestion/rhinnorhea. Mouth/Throat: Mucous membranes are moist.    Cardiovascular: Normal rate, regular rhythm. Grossly normal heart sounds.  Good peripheral circulation. Respiratory: Increased respiratory effort with mild tachypnea, and appears painful to take a deep breath.  Gastrointestinal: Soft and nontender. No distention.    Musculoskeletal: No lower extremity tenderness nor edema.  Warm and well perfused Neurologic:  Normal speech and language. No gross focal neurologic deficits are appreciated.  Skin:  Skin is warm, dry and intact. No rash noted. Psychiatric: Mood and affect are normal. Speech and behavior are normal.  ____________________________________________   LABS (all labs ordered are listed, but only abnormal results are displayed)  Labs Reviewed  CBC - Abnormal; Notable for the following components:      Result Value   WBC 12.3 (*)    All other components within normal limits  COMPREHENSIVE METABOLIC PANEL -  Abnormal; Notable for the following components:   Glucose, Bld 151 (*)    Total Protein 8.6 (*)    ALT 45 (*)    Total Bilirubin 1.3 (*)    All other components within normal limits  CULTURE, BLOOD (ROUTINE X 2)  CULTURE, BLOOD (ROUTINE X 2)  SARS CORONAVIRUS 2 BY RT PCR (HOSPITAL ORDER, PERFORMED IN [facility])  MAGNESIUM  LACTIC ACID, PLASMA  LACTIC ACID, PLASMA  POC SARS CORONAVIRUS 2 AG  POC SARS CORONAVIRUS 2 AG -  ED  TROPONIN I (HIGH SENSITIVITY)   ____________________________________________  EKG  ED ECG REPORT I, Jene Every, the attending physician, personally viewed and interpreted this ECG.  Date: 09/18/2019  Rhythm: normal sinus rhythm QRS Axis: normal Intervals: Abnormal ST/T Wave abnormalities: Abnormal Narrative Interpretation: Consistent with Brugada  ____________________________________________  RADIOLOGY Chest x-ray consistent with right lower lobe pneumonia, reviewed by me, possible pleural effusion as well ____________________________________________   PROCEDURES  Procedure(s) performed: No  .1-3 Lead EKG Interpretation Performed by: Jene Every, MD Authorized by: Jene Every, MD     Interpretation: normal     ECG rate assessment: normal     Rhythm: sinus rhythm     Ectopy: none     Conduction: normal       Critical Care performed: No ____________________________________________   INITIAL IMPRESSION / ASSESSMENT AND PLAN / ED COURSE  Pertinent labs & imaging results that were available during my care of the patient were reviewed by me and considered in my medical decision making (see chart for details).  Patient presents with right-sided chest discomfort, chills, pleurisy. Differential includes pneumonia, COVID-19, PE, less likely pneumothorax  Patient is a history of Brugada syndrome but denies any dizziness or lightheadedness. Immediately placed on the cardiac monitor. Will give morphine and Zofran after  verifying these are not medications to be avoided in Brugada syndrome given that he is having significant discomfort. Pending labs, chest x-ray   Chest x-ray is consistent with right lower lobe pneumonia with possible pleural effusion.  Labs pending.  Patient will require admission given significant discomfort, increased work of breathing and history of Brugada syndrome    ____________________________________________   FINAL CLINICAL IMPRESSION(S) / ED DIAGNOSES  Final diagnoses:  Community acquired pneumonia of right lower lobe of lung        Note:  This document was prepared using Conservation officer, historic buildings and may include unintentional dictation errors.   Jene Every, MD 09/18/19 [phone]

## 2019-09-18 NOTE — ED Triage Notes (Signed)
C/O right sided muscle pain. C/O right sided chest pain with inspiration.  Patient states unable to sleep over night.  Patient is SOB with speaking.

## 2019-09-18 NOTE — ED Notes (Signed)
Attempted to call report, per secretary the RN taking the pt will call me back

## 2019-09-19 ENCOUNTER — Encounter: Payer: Self-pay | Admitting: Internal Medicine

## 2019-09-19 ENCOUNTER — Observation Stay: Payer: BC Managed Care – PPO

## 2019-09-19 DIAGNOSIS — I2690 Septic pulmonary embolism without acute cor pulmonale: Secondary | ICD-10-CM | POA: Diagnosis not present

## 2019-09-19 DIAGNOSIS — Z7901 Long term (current) use of anticoagulants: Secondary | ICD-10-CM | POA: Diagnosis not present

## 2019-09-19 DIAGNOSIS — I2602 Saddle embolus of pulmonary artery with acute cor pulmonale: Secondary | ICD-10-CM | POA: Diagnosis not present

## 2019-09-19 DIAGNOSIS — I2601 Septic pulmonary embolism with acute cor pulmonale: Secondary | ICD-10-CM | POA: Diagnosis not present

## 2019-09-19 DIAGNOSIS — J189 Pneumonia, unspecified organism: Secondary | ICD-10-CM | POA: Diagnosis not present

## 2019-09-19 DIAGNOSIS — I2693 Single subsegmental pulmonary embolism without acute cor pulmonale: Secondary | ICD-10-CM | POA: Diagnosis present

## 2019-09-19 DIAGNOSIS — A419 Sepsis, unspecified organism: Secondary | ICD-10-CM | POA: Diagnosis present

## 2019-09-19 DIAGNOSIS — J90 Pleural effusion, not elsewhere classified: Secondary | ICD-10-CM | POA: Diagnosis present

## 2019-09-19 DIAGNOSIS — E872 Acidosis: Secondary | ICD-10-CM | POA: Diagnosis present

## 2019-09-19 DIAGNOSIS — Z20822 Contact with and (suspected) exposure to covid-19: Secondary | ICD-10-CM | POA: Diagnosis present

## 2019-09-19 DIAGNOSIS — I498 Other specified cardiac arrhythmias: Secondary | ICD-10-CM | POA: Diagnosis present

## 2019-09-19 DIAGNOSIS — R079 Chest pain, unspecified: Secondary | ICD-10-CM | POA: Diagnosis not present

## 2019-09-19 DIAGNOSIS — R0602 Shortness of breath: Secondary | ICD-10-CM | POA: Diagnosis not present

## 2019-09-19 LAB — LEGIONELLA PNEUMOPHILA SEROGP 1 UR AG: L. pneumophila Serogp 1 Ur Ag: NEGATIVE

## 2019-09-19 MED ORDER — APIXABAN 5 MG PO TABS
10.0000 mg | ORAL_TABLET | Freq: Two times a day (BID) | ORAL | Status: DC
  Administered 2019-09-19 – 2019-09-22 (×6): 10 mg via ORAL
  Filled 2019-09-19 (×6): qty 2

## 2019-09-19 MED ORDER — APIXABAN 5 MG PO TABS: 5.0000 mg | ORAL_TABLET | Freq: Two times a day (BID) | ORAL | Status: DC

## 2019-09-19 MED ORDER — IOHEXOL 350 MG/ML SOLN
75.0000 mL | Freq: Once | INTRAVENOUS | Status: AC | PRN
  Administered 2019-09-19: 75 mL via INTRAVENOUS

## 2019-09-19 NOTE — Progress Notes (Signed)
ANTICOAGULATION CONSULT NOTE - Initial Consult  Pharmacy Consult for apixaban Indication: pulmonary embolus  Allergies  Allergen Reactions  . Sulfa Antibiotics Rash    "when I was a kid" rash     Patient Measurements: Height: 6' (182.9 cm) Weight: 94.7 kg (208 lb 11.2 oz) IBW/kg (Calculated) : 77.6  Vital Signs: Temp: 101.1 F (38.4 C) (05/25 1559) Temp Source: Oral (05/25 1559) BP: 138/92 (05/25 1559) Pulse Rate: 99 (05/25 1559)  Labs: Recent Labs    09/18/19 0728 09/18/19 0933  HGB 15.9  --   HCT 45.0  --   PLT 313  --   CREATININE 1.08  --   TROPONINIHS <2 3    Estimated Creatinine Clearance: 119.4 mL/min (by C-G formula based on SCr of 1.08 mg/dL).   Medical History: Past Medical History:  Diagnosis Date  . Abnormal ECG    Brugada Type 1 pattern  . Brugada syndrome    01/2017 presented after working out with lightheadedness, heart flutter and irregular HB  . Eczema      Assessment: 29 yo male to start on apixaban for PE  Goal of Therapy:  Monitor platelets by anticoagulation protocol: Yes   Plan:  D/c enoxaparin Apixaban 10 mg PO BID x7 days then 5 mg PO BID SCr and CBC at least every three days per policy  Pharmacy will continue to follow.   Crist Fat L 09/19/2019,5:50 PM

## 2019-09-19 NOTE — Progress Notes (Signed)
[facility] Progress Note  Patient: Philip Houston    KYH:[id number]  DOA: 09/18/2019     Date of Service: the patient was seen and examined on 09/19/2019  Chief Complaint  Patient presents with  . Shortness of Breath   Brief hospital course: Past medical history of Brugada type I syndrome.  Presents with sudden onset of chest pain associated with shortness of breath.  Found to have community-acquired pneumonia as well as pulmonary embolism. Currently further plan is continue IV antibiotics monitor culture.  Assessment and Plan: 1. Community-acquired pneumonia Right sided chest pain. My leukocytosis.  Persistent fever. Started on IV Rocephin and doxycycline. Strep antigen negative. Follow-up on cultures. Continue gentle IV hydration.  2.  Pulmonary embolism CT PE protocol was performed given patient's complaint of chest pain and no prior onset of respiratory illness or infection symptoms until arrival to ER. CT PE protocol is positive for left-sided pulmonary embolism as well as possible right-sided pulmonary embolism. We will start the patient on Eliquis after discussion regarding various option. Hypercoagulable work-up recommended. 3 months anticoagulation given provoked event.  Patient was driving for 4 hours prior to arrival to ER.  3.  Long-term anticoagulation Patient should have light duty work for the initial 2 weeks.  Patient can resume regular work after that. Patient should be off of work until next Monday.  His next work week day is  Wednesday.  4.  History of Brugada syndrome Follows up with cardiology Dr. Graciela Husbands. Continue to monitor.  Avoid QT prolonging medications.  Diet: Cardiac diet  Advance goals of care discussion: Full code  Family Communication: family was present at bedside, at the time of interview.  The pt provided permission to discuss medical plan with the family. Opportunity was given to ask question and all questions were answered  satisfactorily.   Disposition:  Status is: Inpatient  Remains inpatient appropriate because:Ongoing diagnostic testing needed not appropriate for outpatient work up   Dispo: The patient is from: Home              Anticipated d/c is to: Home              Anticipated d/c date is: 1 day              Patient currently is not medically stable to d/c.  Subjective: Continues to have chest pain.  Continues to have mild cough.  No nausea no vomiting.  Physical Exam: General:  alert oriented to time, place, and person.  Appear in mild distress, affect appropriate Eyes: PERRL ENT: Oral Mucosa Clear, moist  Neck: no JVD,  Cardiovascular: S1 and S2 Present, no Murmur,  Respiratory: good respiratory effort, Bilateral Air entry equal and Decreased, bilateral  Crackles, no wheezes Abdomen: Bowel Sound present, Soft and no tenderness,  Skin: no rash Extremities: no Pedal edema, no calf tenderness Neurologic: without any new focal findings  Gait not checked due to patient safety concerns  Vitals:   09/19/19 0933 09/19/19 1153 09/19/19 1559 09/19/19 1912  BP: 125/82 135/90 (!) 138/92 (!) 139/91  Pulse: (!) 101 93 99 100  Resp: (!) 22 20 19    Temp: (!) 100.6 F (38.1 C) 98.8 F (37.1 C) (!) 101.1 F (38.4 C) 100 F (37.8 C)  TempSrc:  Oral Oral Oral  SpO2: 91% 96% 95% 94%  Weight:      Height:        Intake/Output Summary (Last 24 hours) at 09/19/2019 2004 Last data filed at 09/19/2019 1830  Gross per 24 hour  Intake 1200 ml  Output 3150 ml  Net -1950 ml   Filed Weights   09/18/19 0722 09/18/19 1706 09/19/19 0431  Weight: 88.5 kg 94.3 kg 94.7 kg    Data Reviewed: I have personally reviewed and interpreted daily labs, tele strips, imagings as discussed above. I reviewed all nursing notes, pharmacy notes, vitals, pertinent old records I have discussed plan of care as described above with RN and patient/family.  CBC: Recent Labs  Lab 09/18/19 0728  WBC 12.3*  HGB 15.9  HCT  45.0  MCV 83.6  PLT 313   Basic Metabolic Panel: Recent Labs  Lab 09/18/19 0728  NA 138  K 3.7  CL 103  CO2 26  GLUCOSE 151*  BUN 10  CREATININE 1.08  CALCIUM 9.4  MG 2.1    Studies: CT ANGIO CHEST PE W OR WO CONTRAST  Addendum Date: 09/19/2019   ADDENDUM REPORT: 09/19/2019 12:16 ADDENDUM: Critical Value/emergent results were called by telephone at the time of interpretation on 09/19/2019 at 12:05 pm to provider [facility] PATEL , who verbally acknowledged these results. Electronically Signed   By: Charline Bills M.D.   On: 09/19/2019 12:16   Result Date: 09/19/2019 CLINICAL DATA:  Shortness of breath, chest pain, history of Brugada syndrome type 1 EXAM: CT ANGIOGRAPHY CHEST WITH CONTRAST TECHNIQUE: Multidetector CT imaging of the chest was performed using the standard protocol during bolus administration of intravenous contrast. Multiplanar CT image reconstructions and MIPs were obtained to evaluate the vascular anatomy. CONTRAST:  40mL OMNIPAQUE IOHEXOL 350 MG/ML SOLN COMPARISON:  Chest radiograph dated 09/18/2019 FINDINGS: Cardiovascular: Satisfactory opacification of the bilateral pulmonary arteries to the segmental level. Subsegmental level is poorly evaluated, particularly in the bilateral lower lobes, due to respiratory motion. However, subsegmental pulmonary emboli are suspected in the left lower lobe (series 5/images 194 and 201), equivocal, but supported by coronal MIP imaging (series 9/image 56). As such, this study is considered positive. Subsegmental right lower lobe pulmonary arteries are poorly evaluated/obscured. Although not tailored for evaluation of the thoracic aorta, there is no evidence of thoracic aortic aneurysm or dissection. Heart is normal in size.  No pericardial effusion. Mediastinum/Nodes: No suspicious mediastinal lymphadenopathy. Visualized thyroid is unremarkable. Lungs/Pleura: Mild patchy subpleural ground-glass opacities in the left lower lobe (series 6/image  71), likely reflecting developing pulmonary infarcts. Additional dependent atelectasis with trace left pleural effusion. Small right pleural effusion with compressive atelectasis in the right lower lobe. Additional subpleural ground-glass opacity in the anterior right lower lobe (series 6/image 59) raises the possibility of a developing pulmonary infarct in this region, suggesting a nonvisualized subsegmental right lower lobe pulmonary embolism. No suspicious pulmonary nodules. No frank interstitial edema. No pneumothorax. Upper Abdomen: Visualized upper abdomen is grossly unremarkable. Musculoskeletal: Visualized osseous structures are within normal limits. Review of the MIP images confirms the above findings. IMPRESSION: Suspected subsegmental pulmonary emboli at least within the left lower lobe. The nonvisualized subsegmental right lower lobe pulmonary artery is also possible. Subpleural ground-glass opacities in the bilateral lower lobes suggest developing pulmonary infarcts. Associated bilateral lower lobe atelectasis with small bilateral pleural effusions, right greater than left. Electronically Signed: By: Charline Bills M.D. On: 09/19/2019 12:04    Scheduled Meds: . apixaban  10 mg Oral BID   Followed by  . [START ON 09/26/2019] apixaban  5 mg Oral BID  . dextromethorphan-guaiFENesin  1 tablet Oral BID   Continuous Infusions: . sodium chloride 75 mL/hr at 09/19/19 0317  . cefTRIAXone (  ROCEPHIN)  IV 1 g (09/19/19 1058)  . doxycycline (VIBRAMYCIN) IV 100 mg (09/19/19 0831)   PRN Meds: acetaminophen, albuterol, morphine injection, oxyCODONE-acetaminophen  Time spent: 35 minutes  Author: Berle Mull, MD [facility] 09/19/2019 8:04 PM  To reach On-call, see care teams to locate the attending and reach out to them via [email or web address]. If 7PM-7AM, please contact night-coverage If you still have difficulty reaching the attending provider, please page the [facility] (Director on Call) for [facility] on amion for assistance.

## 2019-09-20 ENCOUNTER — Inpatient Hospital Stay (HOSPITAL_COMMUNITY)
Admit: 2019-09-20 | Discharge: 2019-09-20 | Disposition: A | Discharge status: Home / Self Care | Payer: BC Managed Care – PPO | Attending: Internal Medicine | Admitting: Internal Medicine

## 2019-09-20 ENCOUNTER — Inpatient Hospital Stay: Payer: BC Managed Care – PPO

## 2019-09-20 DIAGNOSIS — I2602 Saddle embolus of pulmonary artery with acute cor pulmonale: Secondary | ICD-10-CM | POA: Diagnosis not present

## 2019-09-20 DIAGNOSIS — R0602 Shortness of breath: Secondary | ICD-10-CM

## 2019-09-20 DIAGNOSIS — I2601 Septic pulmonary embolism with acute cor pulmonale: Secondary | ICD-10-CM

## 2019-09-20 DIAGNOSIS — R079 Chest pain, unspecified: Secondary | ICD-10-CM | POA: Diagnosis not present

## 2019-09-20 DIAGNOSIS — J189 Pneumonia, unspecified organism: Secondary | ICD-10-CM | POA: Diagnosis not present

## 2019-09-20 DIAGNOSIS — I498 Other specified cardiac arrhythmias: Secondary | ICD-10-CM | POA: Diagnosis not present

## 2019-09-20 DIAGNOSIS — I2690 Septic pulmonary embolism without acute cor pulmonale: Secondary | ICD-10-CM | POA: Diagnosis not present

## 2019-09-20 DIAGNOSIS — R600 Localized edema: Secondary | ICD-10-CM | POA: Diagnosis not present

## 2019-09-20 LAB — COMPREHENSIVE METABOLIC PANEL
ALT: 26 U/L (ref 0–44)
AST: 15 U/L (ref 15–41)
Albumin: 3.7 g/dL (ref 3.5–5.0)
Alkaline Phosphatase: 66 U/L (ref 38–126)
Anion gap: 7 (ref 5–15)
BUN: 7 mg/dL (ref 6–20)
CO2: 27 mmol/L (ref 22–32)
Calcium: 9.2 mg/dL (ref 8.9–10.3)
Chloride: 103 mmol/L (ref 98–111)
Creatinine, Ser: 0.9 mg/dL (ref 0.61–1.24)
GFR calc Af Amer: >60 mL/min (ref >60)
GFR calc non Af Amer: >60 mL/min (ref >60)
Glucose, Bld: 110 mg/dL — ABNORMAL HIGH (ref 70–99)
Potassium: 3.9 mmol/L (ref 3.5–5.1)
Sodium: 137 mmol/L (ref 135–145)
Total Bilirubin: 1.2 mg/dL (ref 0.3–1.2)
Total Protein: 7.4 g/dL (ref 6.5–8.1)

## 2019-09-20 LAB — CBC WITH DIFFERENTIAL/PLATELET
Abs Immature Granulocytes: 0.05 10*3/uL (ref 0.00–0.07)
Basophils Absolute: 0 10*3/uL (ref 0.0–0.1)
Basophils Relative: 0 %
Eosinophils Absolute: 0 10*3/uL (ref 0.0–0.5)
Eosinophils Relative: 0 %
HCT: 40.3 % (ref 39.0–52.0)
Hemoglobin: 14 g/dL (ref 13.0–17.0)
Immature Granulocytes: 1 %
Lymphocytes Relative: 10 %
Lymphs Abs: 1 10*3/uL (ref 0.7–4.0)
MCH: 30 pg (ref 26.0–34.0)
MCHC: 34.7 g/dL (ref 30.0–36.0)
MCV: 86.3 fL (ref 80.0–100.0)
Monocytes Absolute: 1.1 10*3/uL — ABNORMAL HIGH (ref 0.1–1.0)
Monocytes Relative: 10 %
Neutro Abs: 8.3 10*3/uL — ABNORMAL HIGH (ref 1.7–7.7)
Neutrophils Relative %: 79 %
Platelets: 241 10*3/uL (ref 150–400)
RBC: 4.67 MIL/uL (ref 4.22–5.81)
RDW: 12.2 % (ref 11.5–15.5)
WBC: 10.5 10*3/uL (ref 4.0–10.5)
nRBC: 0 % (ref 0.0–0.2)

## 2019-09-20 LAB — ANTITHROMBIN III: AntiThromb III Func: 130 % — ABNORMAL HIGH (ref 75–120)

## 2019-09-20 LAB — MAGNESIUM: Magnesium: 1.7 mg/dL (ref 1.7–2.4)

## 2019-09-20 MED ORDER — DOXYCYCLINE HYCLATE 100 MG PO TABS
100.0000 mg | ORAL_TABLET | Freq: Two times a day (BID) | ORAL | Status: DC
  Administered 2019-09-20 – 2019-09-22 (×4): 100 mg via ORAL
  Filled 2019-09-20 (×4): qty 1

## 2019-09-20 NOTE — Progress Notes (Signed)
PROGRESS NOTE    Philip Houston  NAT:[id number] DOB: 01-Apr-1991 DOA: 09/18/2019 PCP: McLean-Scocuzza, Nino Glow, MD     Brief Narrative:  29 year old WM  PMHx  Brugada type I syndrome.    Presents with sudden onset of chest pain associated with shortness of breath.  Found to have community-acquired pneumonia as well as pulmonary embolism. Currently further plan is continue IV antibiotics monitor culture  Subjective: T-max last 24 hours 39.3 C, A/O x4, positive S OB, positive DOE.  Only able to talk in 4- 5 word sentences   Assessment & Plan:   Principal Problem:   CAP (community acquired pneumonia) Active Problems:   Brugada syndrome   Sepsis/community-acquired pneumonia -On admission met criteria for sepsis RR > 20, HR > 90, positive lactic acidosis, with endorgan damage. -Complete 7-day course antibiotics -5/27 lactic acid, procalcitonin pending -Large majority of patient's symptoms could be explained by his acute PE -Cultures pending   Acute Pulmonary Embolism CT PE protocol was performed given patient's complaint of chest pain and no prior onset of respiratory illness or infection symptoms until arrival to ER. CT PE protocol is positive for left-sided pulmonary embolism as well as possible right-sided pulmonary embolism. We will start the patient on Eliquis after discussion regarding various option. Hypercoagulable work-up recommended. 3 months anticoagulation given provoked event.  Patient was driving for 4 hours prior to arrival to ER.  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   -After initial 2 weeks off patient would need to be placed at a desk job.    History of Brugada syndrome Follows up with cardiology Dr. Caryl Comes. Continue to monitor.  Avoid QT prolonging medications.   DVT prophylaxis: Apixaban Code Status: Full Family Communication: Family at bedside Disposition Plan:  Status  is: Inpatient    Dispo: The patient is from: Home              Anticipated d/c is to: Home              Anticipated d/c date is: 5/30              Patient currently       Consultants:    Procedures/Significant Events:    I have personally reviewed and interpreted all radiology studies and my findings are as above.  VENTILATOR SETTINGS:    Cultures   Antimicrobials:    Devices    LINES / TUBES:      Continuous Infusions: . sodium chloride 75 mL/hr at 09/19/19 0317  . cefTRIAXone (ROCEPHIN)  IV 1 g (09/19/19 1058)  . doxycycline (VIBRAMYCIN) IV 100 mg (09/20/19 0747)     Objective: Vitals:   09/20/19 0215 09/20/19 0421 09/20/19 0748 09/20/19 0759  BP:  126/78  (!) 134/91  Pulse:  94  98  Resp:  20  20  Temp: (!) 101.7 F (38.7 C) (!) 100.5 F (38.1 C) 99.8 F (37.7 C) 100 F (37.8 C)  TempSrc: Oral Oral Oral Oral  SpO2:  93%  93%  Weight:  93 kg    Height:        Intake/Output Summary (Last 24 hours) at 09/20/2019 0825 Last data filed at 09/20/2019 0755 Gross per 24 hour  Intake 3382.32 ml  Output 3351 ml  Net 31.32 ml   Filed Weights   09/18/19 1706 09/19/19 0431 09/20/19 0421  Weight: 94.3 kg 94.7 kg 93 kg    Examination:  General: A/O  x4, positive acute respiratory distress Eyes: negative scleral hemorrhage, negative anisocoria, negative icterus ENT: Negative Runny nose, negative gingival bleeding, Neck:  Negative scars, masses, torticollis, lymphadenopathy, JVD Lungs: Tachypneic clear to auscultation bilaterally without wheezes or crackles Cardiovascular: Tachycardic without murmur gallop or rub normal S1 and S2 Abdomen: negative abdominal pain, nondistended, positive soft, bowel sounds, no rebound, no ascites, no appreciable mass Extremities: No significant cyanosis, clubbing, or edema bilateral lower extremities Skin: Negative rashes, lesions, ulcers Psychiatric:  Negative depression, negative anxiety, negative fatigue, negative  mania  Central nervous system:  Cranial nerves II through XII intact, tongue/uvula midline, all extremities muscle strength 5/5, sensation intact throughout, negative dysarthria, negative expressive aphasia, negative receptive aphasia.  .     Data Reviewed: Care during the described time interval was provided by me .  I have reviewed this patient's available data, including medical history, events of note, physical examination, and all test results as part of my evaluation.  CBC: Recent Labs  Lab 09/18/19 0728 09/20/19 0610  WBC 12.3* 10.5  NEUTROABS  --  8.3*  HGB 15.9 14.0  HCT 45.0 40.3  MCV 83.6 86.3  PLT 313 638   Basic Metabolic Panel: Recent Labs  Lab 09/18/19 0728 09/20/19 0610  NA 138 137  K 3.7 3.9  CL 103 103  CO2 26 27  GLUCOSE 151* 110*  BUN 10 7  CREATININE 1.08 0.90  CALCIUM 9.4 9.2  MG 2.1 1.7   GFR: Estimated Creatinine Clearance: 131.7 mL/min (by C-G formula based on SCr of 0.9 mg/dL). Liver Function Tests: Recent Labs  Lab 09/18/19 0728 09/20/19 0610  AST 23 15  ALT 45* 26  ALKPHOS 84 66  BILITOT 1.3* 1.2  PROT 8.6* 7.4  ALBUMIN 4.9 3.7   No results for input(s): LIPASE, AMYLASE in the last 168 hours. No results for input(s): AMMONIA in the last 168 hours. Coagulation Profile: No results for input(s): INR, PROTIME in the last 168 hours. Cardiac Enzymes: No results for input(s): CKTOTAL, CKMB, CKMBINDEX, TROPONINI in the last 168 hours. BNP (last 3 results) No results for input(s): PROBNP in the last 8760 hours. HbA1C: No results for input(s): HGBA1C in the last 72 hours. CBG: No results for input(s): GLUCAP in the last 168 hours. Lipid Profile: No results for input(s): CHOL, HDL, LDLCALC, TRIG, CHOLHDL, LDLDIRECT in the last 72 hours. Thyroid Function Tests: No results for input(s): TSH, T4TOTAL, FREET4, T3FREE, THYROIDAB in the last 72 hours. Anemia Panel: No results for input(s): VITAMINB12, FOLATE, FERRITIN, TIBC, IRON,  RETICCTPCT in the last 72 hours. Sepsis Labs: Recent Labs  Lab 09/18/19 0801 09/18/19 0933  LATICACIDVEN 1.0 2.3*    Recent Results (from the past 240 hour(s))  Blood culture (routine x 2)     Status: None (Preliminary result)   Collection Time: 09/18/19  8:07 AM   Specimen: BLOOD  Result Value Ref Range Status   Specimen Description BLOOD LEFT [facility]  Final   Special Requests   Final    BOTTLES DRAWN AEROBIC AND ANAEROBIC Blood Culture adequate volume   Culture   Final    NO GROWTH 2 DAYS Performed at [facility], [address] [place], [place] [address]    Report Status PENDING  Incomplete  Blood culture (routine x 2)     Status: None (Preliminary result)   Collection Time: 09/18/19  8:07 AM   Specimen: BLOOD  Result Value Ref Range Status   Specimen Description BLOOD LEFT HAND  Final  Special Requests   Final    BOTTLES DRAWN AEROBIC AND ANAEROBIC Blood Culture adequate volume   Culture   Final    NO GROWTH 2 DAYS Performed at [facility], [address] [place], [place] [address]    Report Status PENDING  Incomplete  SARS Coronavirus 2 by RT PCR (hospital order, performed in [facility] hospital lab) Nasopharyngeal Nasopharyngeal Swab     Status: None   Collection Time: 09/18/19  9:32 AM   Specimen: Nasopharyngeal Swab  Result Value Ref Range Status   SARS Coronavirus 2 NEGATIVE NEGATIVE Final    Comment: (NOTE) SARS-CoV-2 target nucleic acids are NOT DETECTED. The SARS-CoV-2 RNA is generally detectable in upper and lower respiratory specimens during the acute phase of infection. The lowest concentration of SARS-CoV-2 viral copies this assay can detect is 250 copies / mL. A negative result does not preclude SARS-CoV-2 infection and should not be used as the sole basis for treatment or other patient management decisions.  A negative result may occur with improper specimen collection / handling, submission of specimen other than nasopharyngeal  swab, presence of viral mutation(s) within the areas targeted by this assay, and inadequate number of viral copies (<250 copies / mL). A negative result must be combined with clinical observations, patient history, and epidemiological information. Fact Sheet for Patients:   StrictlyIdeas.no Fact Sheet for Healthcare Providers: BankingDealers.co.za This test is not yet approved or cleared  by the [place] FDA and has been authorized for detection and/or diagnosis of SARS-CoV-2 by FDA under an Emergency Use Authorization (EUA).  This EUA will remain in effect (meaning this test can be used) for the duration of the COVID-19 declaration under Section 564(b)(1) of the Act, 21 U.S.C. section 360bbb-3(b)(1), unless the authorization is terminated or revoked sooner. Performed at [facility], [address], [place], [place] [address]          Radiology Studies: CT ANGIO CHEST PE W OR WO CONTRAST  Addendum Date: 09/19/2019   ADDENDUM REPORT: 09/19/2019 12:16 ADDENDUM: Critical Value/emergent results were called by telephone at the time of interpretation on 09/19/2019 at 12:05 pm to provider [facility] PATEL , who verbally acknowledged these results. Electronically Signed   By: Julian Hy M.D.   On: 09/19/2019 12:16   Result Date: 09/19/2019 CLINICAL DATA:  Shortness of breath, chest pain, history of Brugada syndrome type 1 EXAM: CT ANGIOGRAPHY CHEST WITH CONTRAST TECHNIQUE: Multidetector CT imaging of the chest was performed using the standard protocol during bolus administration of intravenous contrast. Multiplanar CT image reconstructions and MIPs were obtained to evaluate the vascular anatomy. CONTRAST:  10m OMNIPAQUE IOHEXOL 350 MG/ML SOLN COMPARISON:  Chest radiograph dated 09/18/2019 FINDINGS: Cardiovascular: Satisfactory opacification of the bilateral pulmonary arteries to the segmental level. Subsegmental level is poorly  evaluated, particularly in the bilateral lower lobes, due to respiratory motion. However, subsegmental pulmonary emboli are suspected in the left lower lobe (series 5/images 194 and 201), equivocal, but supported by coronal MIP imaging (series 9/image 56). As such, this study is considered positive. Subsegmental right lower lobe pulmonary arteries are poorly evaluated/obscured. Although not tailored for evaluation of the thoracic aorta, there is no evidence of thoracic aortic aneurysm or dissection. Heart is normal in size.  No pericardial effusion. Mediastinum/Nodes: No suspicious mediastinal lymphadenopathy. Visualized thyroid is unremarkable. Lungs/Pleura: Mild patchy subpleural ground-glass opacities in the left lower lobe (series 6/image 71), likely reflecting developing pulmonary infarcts. Additional dependent atelectasis with trace left pleural effusion. Small right  pleural effusion with compressive atelectasis in the right lower lobe. Additional subpleural ground-glass opacity in the anterior right lower lobe (series 6/image 59) raises the possibility of a developing pulmonary infarct in this region, suggesting a nonvisualized subsegmental right lower lobe pulmonary embolism. No suspicious pulmonary nodules. No frank interstitial edema. No pneumothorax. Upper Abdomen: Visualized upper abdomen is grossly unremarkable. Musculoskeletal: Visualized osseous structures are within normal limits. Review of the MIP images confirms the above findings. IMPRESSION: Suspected subsegmental pulmonary emboli at least within the left lower lobe. The nonvisualized subsegmental right lower lobe pulmonary artery is also possible. Subpleural ground-glass opacities in the bilateral lower lobes suggest developing pulmonary infarcts. Associated bilateral lower lobe atelectasis with small bilateral pleural effusions, right greater than left. Electronically Signed: By: Julian Hy M.D. On: 09/19/2019 12:04         Scheduled Meds: . apixaban  10 mg Oral BID   Followed by  . [START ON 09/26/2019] apixaban  5 mg Oral BID  . dextromethorphan-guaiFENesin  1 tablet Oral BID   Continuous Infusions: . sodium chloride 75 mL/hr at 09/19/19 0317  . cefTRIAXone (ROCEPHIN)  IV 1 g (09/19/19 1058)  . doxycycline (VIBRAMYCIN) IV 100 mg (09/20/19 0747)     LOS: 1 day    Time spent:40 min    Selita Staiger, Geraldo Docker, MD [facility] Pager [phone]  If 7PM-7AM, please contact night-coverage [email or web address] Password [email or web address] 09/20/2019, 8:25 AM

## 2019-09-21 DIAGNOSIS — I2602 Saddle embolus of pulmonary artery with acute cor pulmonale: Secondary | ICD-10-CM

## 2019-09-21 LAB — RESPIRATORY PANEL BY PCR

## 2019-09-21 LAB — COMPREHENSIVE METABOLIC PANEL
ALT: 23 U/L (ref 0–44)
AST: 16 U/L (ref 15–41)
Albumin: 3.5 g/dL (ref 3.5–5.0)
Alkaline Phosphatase: 67 U/L (ref 38–126)
Anion gap: 10 (ref 5–15)
BUN: 9 mg/dL (ref 6–20)
CO2: 26 mmol/L (ref 22–32)
Calcium: 9.1 mg/dL (ref 8.9–10.3)
Chloride: 102 mmol/L (ref 98–111)
Creatinine, Ser: 0.87 mg/dL (ref 0.61–1.24)
GFR calc Af Amer: >60 mL/min (ref >60)
GFR calc non Af Amer: >60 mL/min (ref >60)
Glucose, Bld: 107 mg/dL — ABNORMAL HIGH (ref 70–99)
Potassium: 3.9 mmol/L (ref 3.5–5.1)
Sodium: 138 mmol/L (ref 135–145)
Total Bilirubin: 1 mg/dL (ref 0.3–1.2)
Total Protein: 7 g/dL (ref 6.5–8.1)

## 2019-09-21 LAB — CBC
HCT: 39 % (ref 39.0–52.0)
Hemoglobin: 13.5 g/dL (ref 13.0–17.0)
MCH: 29.9 pg (ref 26.0–34.0)
MCHC: 34.6 g/dL (ref 30.0–36.0)
MCV: 86.5 fL (ref 80.0–100.0)
Platelets: 266 10*3/uL (ref 150–400)
RBC: 4.51 MIL/uL (ref 4.22–5.81)
RDW: 12 % (ref 11.5–15.5)
WBC: 8.2 10*3/uL (ref 4.0–10.5)
nRBC: 0 % (ref 0.0–0.2)

## 2019-09-21 LAB — ECHOCARDIOGRAM COMPLETE
Height: 72 in
Weight: 3280 oz

## 2019-09-21 LAB — LACTIC ACID, PLASMA: Lactic Acid, Venous: 1 mmol/L (ref 0.5–1.9)

## 2019-09-21 LAB — HOMOCYSTEINE: Homocysteine: 5.3 umol/L (ref 0.0–14.5)

## 2019-09-21 LAB — PROTEIN C, TOTAL: Protein C, Total: 107 % (ref 60–150)

## 2019-09-21 LAB — PHOSPHORUS: Phosphorus: 3 mg/dL (ref 2.5–4.6)

## 2019-09-21 LAB — MAGNESIUM: Magnesium: 1.7 mg/dL (ref 1.7–2.4)

## 2019-09-21 LAB — PROCALCITONIN: Procalcitonin: <0.1 ng/mL

## 2019-09-21 NOTE — Progress Notes (Signed)
Patient ambulated around nurses station twice at a slow pace while holding onto the IV pole for assistance. Patients oxygen saturations remained at 90% on rm air and increased to 96% on 2L via N/C. No complaints of SOB, patient tolerated well.

## 2019-09-21 NOTE — Progress Notes (Signed)
PROGRESS NOTE    Philip Houston  BWI:[id number] DOB: [birth year] DOA: 09/18/2019 PCP: McLean-Scocuzza, Nino Glow, MD     Brief Narrative:  29 year old WM  PMHx  Brugada type I syndrome.    Presents with sudden onset of chest pain associated with shortness of breath.  Found to have community-acquired pneumonia as well as pulmonary embolism. Currently further plan is continue IV antibiotics monitor culture  Subjective: 5/27 afebrile overnight A/O x4, negative S OB, negative DOE.  Much more comfortable   Assessment & Plan:   Principal Problem:   CAP (community acquired pneumonia) Active Problems:   Brugada syndrome   Sepsis/community-acquired pneumonia -On admission met criteria for sepsis RR > 20, HR > 90, positive lactic acidosis, with endorgan damage. -Complete 7-day course antibiotics -5/27 lactic acid, procalcitonin pending -Large majority of patient's symptoms could be explained by his acute PE -Cultures NGTD -5/28 PCXR pending   Acute Pulmonary Embolism CT PE protocol was performed given patient's complaint of chest pain and no prior onset of respiratory illness or infection symptoms until arrival to ER. CT PE protocol is positive for left-sided pulmonary embolism as well as possible right-sided pulmonary embolism. We will start the patient on Eliquis after discussion regarding various option. Hypercoagulable work-up recommended. 3 months anticoagulation given provoked event.  Patient was driving for 4 hours prior to arrival to ER. 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   -After initial 2 weeks off patient would need to be placed at a desk job.    History of Brugada syndrome Follows up with cardiology Dr. Caryl Comes. Continue to monitor.  Avoid QT prolonging medications.   DVT prophylaxis: Apixaban Code Status: Full Family Communication: Family at  bedside Disposition Plan:  Status is: Inpatient    Dispo: The patient is from: Home              Anticipated d/c is to: Home              Anticipated d/c date is: 5/30              Patient currently       Consultants:    Procedures/Significant Events:    I have personally reviewed and interpreted all radiology studies and my findings are as above.  VENTILATOR SETTINGS: Nasal cannula 5/27 Flow; 1 L/min SPO2 95%   Cultures 5/24 SARS coronavirus negative 5/24 blood left AC NGTD 5/24 blood LEFT hand NGTD 5/24 strep pneumo/Legionella urine antigen negative 5/26 respiratory virus panel negative  Antimicrobials:    Devices    LINES / TUBES:      Continuous Infusions: . sodium chloride 75 mL/hr at 09/21/19 0309  . cefTRIAXone (ROCEPHIN)  IV 1 g (09/21/19 0827)     Objective: Vitals:   09/20/19 1939 09/20/19 1944 09/21/19 0412 09/21/19 0729  BP: 128/90 110/70 126/75 118/75  Pulse: 91 95 89 76  Resp:    16  Temp: 99.8 F (37.7 C) 99 F (37.2 C) 99.4 F (37.4 C) 98.2 F (36.8 C)  TempSrc: Oral Oral Oral   SpO2: 99% 93% 96% 95%  Weight:   92.7 kg   Height:        Intake/Output Summary (Last 24 hours) at 09/21/2019 0936 Last data filed at 09/21/2019 0419 Gross per 24 hour  Intake 1239.28 ml  Output 1600 ml  Net -360.72 ml   Filed Weights   09/19/19 0431 09/20/19 0421 09/21/19  6384  Weight: 94.7 kg 93 kg 92.7 kg   Physical Exam:  General:/O x4, no acute respiratory distress Eyes: negative scleral hemorrhage, negative anisocoria, negative icterus ENT: Negative Runny nose, negative gingival bleeding, Neck:  Negative scars, masses, torticollis, lymphadenopathy, JVD Lungs: Clear to auscultation bilaterally without wheezes or crackles Cardiovascular: Regular rate and rhythm without murmur gallop or rub normal S1 and S2 Abdomen: negative abdominal pain, nondistended, positive soft, bowel sounds, no rebound, no ascites, no appreciable  mass Extremities: No significant cyanosis, clubbing, or edema bilateral lower extremities Skin: Negative rashes, lesions, ulcers Psychiatric:  Negative depression, negative anxiety, negative fatigue, negative mania  Central nervous system:  Cranial nerves II through XII intact, tongue/uvula midline, all extremities muscle strength 5/5, sensation intact throughout,  negative dysarthria, negative expressive aphasia, negative receptive aphasia.  .     Data Reviewed: Care during the described time interval was provided by me .  I have reviewed this patient's available data, including medical history, events of note, physical examination, and all test results as part of my evaluation.  CBC: Recent Labs  Lab 09/18/19 0728 09/20/19 0610 09/21/19 0443  WBC 12.3* 10.5 8.2  NEUTROABS  --  8.3*  --   HGB 15.9 14.0 13.5  HCT 45.0 40.3 39.0  MCV 83.6 86.3 86.5  PLT 313 241 665   Basic Metabolic Panel: Recent Labs  Lab 09/18/19 0728 09/20/19 0610 09/21/19 0443  NA 138 137 138  K 3.7 3.9 3.9  CL 103 103 102  CO2 '26 27 26  ' GLUCOSE 151* 110* 107*  BUN '10 7 9  ' CREATININE 1.08 0.90 0.87  CALCIUM 9.4 9.2 9.1  MG 2.1 1.7 1.7  PHOS  --   --  3.0   GFR: Estimated Creatinine Clearance: 136.3 mL/min (by C-G formula based on SCr of 0.87 mg/dL). Liver Function Tests: Recent Labs  Lab 09/18/19 0728 09/20/19 0610 09/21/19 0443  AST '23 15 16  ' ALT 45* 26 23  ALKPHOS 84 66 67  BILITOT 1.3* 1.2 1.0  PROT 8.6* 7.4 7.0  ALBUMIN 4.9 3.7 3.5   No results for input(s): LIPASE, AMYLASE in the last 168 hours. No results for input(s): AMMONIA in the last 168 hours. Coagulation Profile: No results for input(s): INR, PROTIME in the last 168 hours. Cardiac Enzymes: No results for input(s): CKTOTAL, CKMB, CKMBINDEX, TROPONINI in the last 168 hours. BNP (last 3 results) No results for input(s): PROBNP in the last 8760 hours. HbA1C: No results for input(s): HGBA1C in the last 72 hours. CBG: No  results for input(s): GLUCAP in the last 168 hours. Lipid Profile: No results for input(s): CHOL, HDL, LDLCALC, TRIG, CHOLHDL, LDLDIRECT in the last 72 hours. Thyroid Function Tests: No results for input(s): TSH, T4TOTAL, FREET4, T3FREE, THYROIDAB in the last 72 hours. Anemia Panel: No results for input(s): VITAMINB12, FOLATE, FERRITIN, TIBC, IRON, RETICCTPCT in the last 72 hours. Sepsis Labs: Recent Labs  Lab 09/18/19 0801 09/18/19 0933 09/21/19 0443  PROCALCITON  --   --  <0.10  LATICACIDVEN 1.0 2.3* 1.0    Recent Results (from the past 240 hour(s))  Blood culture (routine x 2)     Status: None (Preliminary result)   Collection Time: 09/18/19  8:07 AM   Specimen: BLOOD  Result Value Ref Range Status   Specimen Description BLOOD LEFT [facility]  Final   Special Requests   Final    BOTTLES DRAWN AEROBIC AND ANAEROBIC Blood Culture adequate volume   Culture   Final  NO GROWTH 3 DAYS Performed at [facility], [address] [place], [place] [address]    Report Status PENDING  Incomplete  Blood culture (routine x 2)     Status: None (Preliminary result)   Collection Time: 09/18/19  8:07 AM   Specimen: BLOOD  Result Value Ref Range Status   Specimen Description BLOOD LEFT HAND  Final   Special Requests   Final    BOTTLES DRAWN AEROBIC AND ANAEROBIC Blood Culture adequate volume   Culture   Final    NO GROWTH 3 DAYS Performed at [facility], [address], [place], [place] [address]    Report Status PENDING  Incomplete  SARS Coronavirus 2 by RT PCR (hospital order, performed in [place] hospital lab) Nasopharyngeal Nasopharyngeal Swab     Status: None   Collection Time: 09/18/19  9:32 AM   Specimen: Nasopharyngeal Swab  Result Value Ref Range Status   SARS Coronavirus 2 NEGATIVE NEGATIVE Final    Comment: (NOTE) SARS-CoV-2 target nucleic acids are NOT DETECTED. The SARS-CoV-2 RNA is generally detectable in upper and lower respiratory specimens during  the acute phase of infection. The lowest concentration of SARS-CoV-2 viral copies this assay can detect is 250 copies / mL. A negative result does not preclude SARS-CoV-2 infection and should not be used as the sole basis for treatment or other patient management decisions.  A negative result may occur with improper specimen collection / handling, submission of specimen other than nasopharyngeal swab, presence of viral mutation(s) within the areas targeted by this assay, and inadequate number of viral copies (<250 copies / mL). A negative result must be combined with clinical observations, patient history, and epidemiological information. Fact Sheet for Patients:   StrictlyIdeas.no Fact Sheet for Healthcare Providers: BankingDealers.co.za This test is not yet approved or cleared  by the [place] FDA and has been authorized for detection and/or diagnosis of SARS-CoV-2 by FDA under an Emergency Use Authorization (EUA).  This EUA will remain in effect (meaning this test can be used) for the duration of the COVID-19 declaration under Section 564(b)(1) of the Act, 21 U.S.C. section 360bbb-3(b)(1), unless the authorization is terminated or revoked sooner. Performed at [facility], [address] [place], [place] [address]   Respiratory Panel by PCR     Status: None   Collection Time: 09/20/19  9:10 PM  Result Value Ref Range Status   Adenovirus NOT DETECTED NOT DETECTED Final   Coronavirus 229E NOT DETECTED NOT DETECTED Final    Comment: (NOTE) The Coronavirus on the Respiratory Panel, DOES NOT test for the novel  Coronavirus (2019 nCoV)    Coronavirus HKU1 NOT DETECTED NOT DETECTED Final   Coronavirus NL63 NOT DETECTED NOT DETECTED Final   Coronavirus OC43 NOT DETECTED NOT DETECTED Final   Metapneumovirus NOT DETECTED NOT DETECTED Final   Rhinovirus / Enterovirus NOT DETECTED NOT DETECTED Final   Influenza A NOT DETECTED  NOT DETECTED Final   Influenza B NOT DETECTED NOT DETECTED Final   Parainfluenza Virus 1 NOT DETECTED NOT DETECTED Final   Parainfluenza Virus 2 NOT DETECTED NOT DETECTED Final   Parainfluenza Virus 3 NOT DETECTED NOT DETECTED Final   Parainfluenza Virus 4 NOT DETECTED NOT DETECTED Final   Respiratory Syncytial Virus NOT DETECTED NOT DETECTED Final   Bordetella pertussis NOT DETECTED NOT DETECTED Final   Chlamydophila pneumoniae NOT DETECTED NOT DETECTED Final   Mycoplasma pneumoniae NOT DETECTED NOT DETECTED Final    Comment: Performed at  [facility], [place] [address], [place], [place] [address]         Radiology Studies: CT ANGIO CHEST PE W OR WO CONTRAST  Addendum Date: 09/19/2019   ADDENDUM REPORT: 09/19/2019 12:16 ADDENDUM: Critical Value/emergent results were called by telephone at the time of interpretation on 09/19/2019 at 12:05 pm to provider [facility] PATEL , who verbally acknowledged these results. Electronically Signed   By: Julian Hy M.D.   On: 09/19/2019 12:16   Result Date: 09/19/2019 CLINICAL DATA:  Shortness of breath, chest pain, history of Brugada syndrome type 1 EXAM: CT ANGIOGRAPHY CHEST WITH CONTRAST TECHNIQUE: Multidetector CT imaging of the chest was performed using the standard protocol during bolus administration of intravenous contrast. Multiplanar CT image reconstructions and MIPs were obtained to evaluate the vascular anatomy. CONTRAST:  36m OMNIPAQUE IOHEXOL 350 MG/ML SOLN COMPARISON:  Chest radiograph dated 09/18/2019 FINDINGS: Cardiovascular: Satisfactory opacification of the bilateral pulmonary arteries to the segmental level. Subsegmental level is poorly evaluated, particularly in the bilateral lower lobes, due to respiratory motion. However, subsegmental pulmonary emboli are suspected in the left lower lobe (series 5/images 194 and 201), equivocal, but supported by coronal MIP imaging (series 9/image 56). As such, this study is considered positive.  Subsegmental right lower lobe pulmonary arteries are poorly evaluated/obscured. Although not tailored for evaluation of the thoracic aorta, there is no evidence of thoracic aortic aneurysm or dissection. Heart is normal in size.  No pericardial effusion. Mediastinum/Nodes: No suspicious mediastinal lymphadenopathy. Visualized thyroid is unremarkable. Lungs/Pleura: Mild patchy subpleural ground-glass opacities in the left lower lobe (series 6/image 71), likely reflecting developing pulmonary infarcts. Additional dependent atelectasis with trace left pleural effusion. Small right pleural effusion with compressive atelectasis in the right lower lobe. Additional subpleural ground-glass opacity in the anterior right lower lobe (series 6/image 59) raises the possibility of a developing pulmonary infarct in this region, suggesting a nonvisualized subsegmental right lower lobe pulmonary embolism. No suspicious pulmonary nodules. No frank interstitial edema. No pneumothorax. Upper Abdomen: Visualized upper abdomen is grossly unremarkable. Musculoskeletal: Visualized osseous structures are within normal limits. Review of the MIP images confirms the above findings. IMPRESSION: Suspected subsegmental pulmonary emboli at least within the left lower lobe. The nonvisualized subsegmental right lower lobe pulmonary artery is also possible. Subpleural ground-glass opacities in the bilateral lower lobes suggest developing pulmonary infarcts. Associated bilateral lower lobe atelectasis with small bilateral pleural effusions, right greater than left. Electronically Signed: By: SJulian HyM.D. On: 09/19/2019 12:04   [place]Venous Img Lower Bilateral (DVT)  Result Date: 09/20/2019 CLINICAL DATA:  Bilateral lower extremity pain and edema. Recent diagnosis of pulmonary embolism. Evaluate for lower extremity DVT. EXAM: BILATERAL LOWER EXTREMITY VENOUS DOPPLER ULTRASOUND TECHNIQUE: Gray-scale sonography with graded compression, as well  as color Doppler and duplex ultrasound were performed to evaluate the lower extremity deep venous systems from the level of the common femoral vein and including the common femoral, femoral, profunda femoral, popliteal and calf veins including the posterior tibial, peroneal and gastrocnemius veins when visible. The superficial great saphenous vein was also interrogated. Spectral Doppler was utilized to evaluate flow at rest and with distal augmentation maneuvers in the common femoral, femoral and popliteal veins. COMPARISON:  Chest CT-09/19/2019 FINDINGS: RIGHT LOWER EXTREMITY Common Femoral Vein: No evidence of thrombus. Normal compressibility, respiratory phasicity and response to augmentation. Saphenofemoral Junction: No evidence of thrombus. Normal compressibility and flow on color Doppler imaging. Profunda Femoral Vein: No evidence of thrombus. Normal compressibility and flow on color Doppler  imaging. Femoral Vein: No evidence of thrombus. Normal compressibility, respiratory phasicity and response to augmentation. Popliteal Vein: No evidence of thrombus. Normal compressibility, respiratory phasicity and response to augmentation. Calf Veins: No evidence of thrombus. Normal compressibility and flow on color Doppler imaging. Superficial Great Saphenous Vein: No evidence of thrombus. Normal compressibility. Venous Reflux:  None. Other Findings:  None. LEFT LOWER EXTREMITY Common Femoral Vein: No evidence of thrombus. Normal compressibility, respiratory phasicity and response to augmentation. Saphenofemoral Junction: No evidence of thrombus. Normal compressibility and flow on color Doppler imaging. Profunda Femoral Vein: No evidence of thrombus. Normal compressibility and flow on color Doppler imaging. Femoral Vein: No evidence of thrombus. Normal compressibility, respiratory phasicity and response to augmentation. Popliteal Vein: No evidence of thrombus. Normal compressibility, respiratory phasicity and response to  augmentation. Calf Veins: No evidence of thrombus. Normal compressibility and flow on color Doppler imaging. Superficial Great Saphenous Vein: No evidence of thrombus. Normal compressibility. Venous Reflux:  None. Other Findings:  None. IMPRESSION: No evidence of DVT within either lower extremity. Electronically Signed   By: Sandi Mariscal M.D.   On: 09/20/2019 11:42   ECHOCARDIOGRAM COMPLETE  Result Date: 09/21/2019    ECHOCARDIOGRAM REPORT   Patient Name:   KAYDON HUSBY Date of Exam: 09/20/2019 Medical Rec #:  [id number]   Height:       72.0 in Accession #:    [id number]  Weight:       205.0 lb Date of Birth:  [birth year]   BSA:          2.153 m Patient Age:    28 years    BP:           110/70 mmHg Patient Gender: M           HR:           86 bpm. Exam Location:  [facility] Procedure: 2D Echo, Cardiac Doppler and Color Doppler Indications:     Pulmonary embolus  History:         Patient has prior history of Echocardiogram examinations, most                  recent 01/25/2017. Abnormal ECG. Brugada Syndrome.  Sonographer:     Wilford Sports Rodgers-Jones Referring Phys:  [id number] [place] Diagnosing Phys: Kate Sable MD IMPRESSIONS  1. Left ventricular ejection fraction, by estimation, is 60 to 65%. The left ventricle has normal function. The left ventricle has no regional wall motion abnormalities. Left ventricular diastolic parameters were normal.  2. Right ventricular systolic function is normal. The right ventricular size is moderately enlarged. There is normal pulmonary artery systolic pressure.  3. The mitral valve is normal in structure. No evidence of mitral valve regurgitation. No evidence of mitral stenosis.  4. The aortic valve is normal in structure. Aortic valve regurgitation is not visualized. No aortic stenosis is present.  5. The inferior vena cava is normal in size with greater than 50% respiratory variability, suggesting right atrial pressure of 3 mmHg. FINDINGS  Left Ventricle: Left ventricular  ejection fraction, by estimation, is 60 to 65%. The left ventricle has normal function. The left ventricle has no regional wall motion abnormalities. The left ventricular internal cavity size was normal in size. There is  no left ventricular hypertrophy. Left ventricular diastolic parameters were normal. Right Ventricle: The right ventricular size is moderately enlarged. No increase in right ventricular wall thickness. Right ventricular systolic function is normal. There is normal pulmonary artery systolic pressure. The tricuspid  regurgitant velocity is 2.16 m/s, and with an assumed right atrial pressure of 3 mmHg, the estimated right ventricular systolic pressure is 16.1 mmHg. Left Atrium: Left atrial size was normal in size. Right Atrium: Right atrial size was normal in size. Pericardium: There is no evidence of pericardial effusion. Mitral Valve: The mitral valve is normal in structure. Normal mobility of the mitral valve leaflets. No evidence of mitral valve regurgitation. No evidence of mitral valve stenosis. Tricuspid Valve: The tricuspid valve is normal in structure. Tricuspid valve regurgitation is not demonstrated. No evidence of tricuspid stenosis. Aortic Valve: The aortic valve is normal in structure. Aortic valve regurgitation is not visualized. No aortic stenosis is present. Pulmonic Valve: The pulmonic valve was normal in structure. Pulmonic valve regurgitation is not visualized. No evidence of pulmonic stenosis. Aorta: The aortic root is normal in size and structure. Venous: The inferior vena cava is normal in size with greater than 50% respiratory variability, suggesting right atrial pressure of 3 mmHg. IAS/Shunts: No atrial level shunt detected by color flow Doppler.  LEFT VENTRICLE PLAX 2D LVIDd:         4.49 cm  Diastology LVIDs:         2.75 cm  LV e' lateral:   6.96 cm/s LV PW:         0.82 cm  LV E/e' lateral: 12.4 LV IVS:        0.95 cm  LV e' medial:    9.25 cm/s LVOT diam:     2.00 cm  LV  E/e' medial:  9.4 LV SV:         43 LV SV Index:   20 LVOT Area:     3.14 cm  RIGHT VENTRICLE RV Basal diam:  4.50 cm RV S prime:     20.30 cm/s TAPSE (M-mode): 2.8 cm LEFT ATRIUM             Index       RIGHT ATRIUM           Index LA diam:        4.00 cm 1.86 cm/m  RA Area:     18.80 cm LA Vol (A2C):   29.1 ml 13.52 ml/m RA Volume:   62.20 ml  28.89 ml/m LA Vol (A4C):   41.9 ml 19.46 ml/m LA Biplane Vol: 37.6 ml 17.46 ml/m  AORTIC VALVE LVOT Vmax:   88.10 cm/s LVOT Vmean:  62.100 cm/s LVOT VTI:    0.137 m  AORTA Ao Root diam: 3.20 cm MITRAL VALVE               TRICUSPID VALVE MV Area (PHT): 5.13 cm    TR Peak grad:   18.7 mmHg MV Decel Time: 148 msec    TR Vmax:        216.00 cm/s MV E velocity: 86.50 cm/s MV A velocity: 56.10 cm/s  SHUNTS MV E/A ratio:  1.54        Systemic VTI:  0.14 m                            Systemic Diam: 2.00 cm Kate Sable MD Electronically signed by Kate Sable MD Signature Date/Time: 09/21/2019/8:11:51 AM    Final         Scheduled Meds: . apixaban  10 mg Oral BID   Followed by  . [START ON 09/26/2019] apixaban  5 mg Oral BID  . dextromethorphan-guaiFENesin  1  tablet Oral BID  . doxycycline  100 mg Oral Q12H   Continuous Infusions: . sodium chloride 75 mL/hr at 09/21/19 0309  . cefTRIAXone (ROCEPHIN)  IV 1 g (09/21/19 0827)     LOS: 2 days    Time spent:40 min    Natika Geyer, Geraldo Docker, MD [facility] Pager [phone]  If 7PM-7AM, please contact night-coverage [email or web address] Password [facility] 09/21/2019, 9:36 AM

## 2019-09-21 NOTE — Progress Notes (Signed)
SATURATION QUALIFICATIONS: (This note is used to comply with regulatory documentation for home oxygen)  Patient Saturations on Room Air at Rest = 95%  Patient Saturations on Room Air while Ambulating = 90%  Patient Saturations on 2 Liters of oxygen while Ambulating = 96%  Please briefly explain why patient needs home oxygen: Patients oxygen saturations did not remain above 94% on rm air while ambulating.

## 2019-09-22 ENCOUNTER — Telehealth: Payer: Self-pay | Admitting: Internal Medicine

## 2019-09-22 ENCOUNTER — Inpatient Hospital Stay: Payer: BC Managed Care – PPO

## 2019-09-22 DIAGNOSIS — J189 Pneumonia, unspecified organism: Secondary | ICD-10-CM | POA: Diagnosis not present

## 2019-09-22 DIAGNOSIS — I2690 Septic pulmonary embolism without acute cor pulmonale: Secondary | ICD-10-CM

## 2019-09-22 DIAGNOSIS — J90 Pleural effusion, not elsewhere classified: Secondary | ICD-10-CM | POA: Diagnosis not present

## 2019-09-22 DIAGNOSIS — I498 Other specified cardiac arrhythmias: Secondary | ICD-10-CM | POA: Diagnosis not present

## 2019-09-22 LAB — CBC
HCT: 38 % — ABNORMAL LOW (ref 39.0–52.0)
Hemoglobin: 13.5 g/dL (ref 13.0–17.0)
MCH: 30 pg (ref 26.0–34.0)
MCHC: 35.5 g/dL (ref 30.0–36.0)
MCV: 84.4 fL (ref 80.0–100.0)
Platelets: 305 10*3/uL (ref 150–400)
RBC: 4.5 MIL/uL (ref 4.22–5.81)
RDW: 12.4 % (ref 11.5–15.5)
WBC: 7.1 10*3/uL (ref 4.0–10.5)
nRBC: 0 % (ref 0.0–0.2)

## 2019-09-22 LAB — COMPREHENSIVE METABOLIC PANEL
ALT: 39 U/L (ref 0–44)
AST: 28 U/L (ref 15–41)
Albumin: 3.5 g/dL (ref 3.5–5.0)
Alkaline Phosphatase: 71 U/L (ref 38–126)
Anion gap: 8 (ref 5–15)
BUN: 14 mg/dL (ref 6–20)
CO2: 26 mmol/L (ref 22–32)
Calcium: 9.2 mg/dL (ref 8.9–10.3)
Chloride: 105 mmol/L (ref 98–111)
Creatinine, Ser: 0.95 mg/dL (ref 0.61–1.24)
GFR calc Af Amer: >60 mL/min (ref >60)
GFR calc non Af Amer: >60 mL/min (ref >60)
Glucose, Bld: 100 mg/dL — ABNORMAL HIGH (ref 70–99)
Potassium: 4.1 mmol/L (ref 3.5–5.1)
Sodium: 139 mmol/L (ref 135–145)
Total Bilirubin: 0.7 mg/dL (ref 0.3–1.2)
Total Protein: 7.1 g/dL (ref 6.5–8.1)

## 2019-09-22 LAB — BETA-2-GLYCOPROTEIN I ABS, IGG/M/A
Beta-2 Glyco I IgG: <9 GPI IgG units (ref 0–20)
Beta-2-Glycoprotein I IgA: <9 GPI IgA units (ref 0–25)
Beta-2-Glycoprotein I IgM: <9 GPI IgM units (ref 0–32)

## 2019-09-22 LAB — PROTEIN C ACTIVITY: Protein C Activity: 120 % (ref 73–180)

## 2019-09-22 LAB — PROTEIN S, TOTAL: Protein S Ag, Total: 123 % (ref 60–150)

## 2019-09-22 LAB — MAGNESIUM: Magnesium: 1.9 mg/dL (ref 1.7–2.4)

## 2019-09-22 LAB — PROTEIN S ACTIVITY: Protein S Activity: 84 % (ref 63–140)

## 2019-09-22 LAB — PROTHROMBIN GENE MUTATION

## 2019-09-22 LAB — CARDIOLIPIN ANTIBODIES, IGG, IGM, IGA
Anticardiolipin IgA: <9 APL U/mL (ref 0–11)
Anticardiolipin IgG: <9 GPL U/mL (ref 0–14)
Anticardiolipin IgM: <9 MPL U/mL (ref 0–12)

## 2019-09-22 LAB — PHOSPHORUS: Phosphorus: 3.9 mg/dL (ref 2.5–4.6)

## 2019-09-22 MED ORDER — APIXABAN 5 MG PO TABS: 10.0000 mg | ORAL_TABLET | Freq: Two times a day (BID) | ORAL | 0 refills | Status: DC

## 2019-09-22 MED ORDER — OXYCODONE-ACETAMINOPHEN 5-325 MG PO TABS: 1.0000 | ORAL_TABLET | ORAL | 0 refills | Status: DC | PRN

## 2019-09-22 MED ORDER — APIXABAN 5 MG PO TABS: 5.0000 mg | ORAL_TABLET | Freq: Two times a day (BID) | ORAL | 0 refills | Status: DC

## 2019-09-22 MED ORDER — ALBUTEROL SULFATE HFA 108 (90 BASE) MCG/ACT IN AERS: 2.0000 | INHALATION_SPRAY | RESPIRATORY_TRACT | 0 refills | Status: DC | PRN

## 2019-09-22 MED ORDER — DOXYCYCLINE HYCLATE 100 MG PO TABS: 100.0000 mg | ORAL_TABLET | Freq: Two times a day (BID) | ORAL | 0 refills | Status: DC

## 2019-09-22 MED ORDER — CEFIXIME 400 MG PO CAPS
400.0000 mg | ORAL_CAPSULE | Freq: Every day | ORAL | Status: DC
  Administered 2019-09-22: 400 mg via ORAL
  Filled 2019-09-22: qty 1

## 2019-09-22 MED ORDER — CEFIXIME 400 MG PO CAPS: 400.0000 mg | ORAL_CAPSULE | Freq: Every day | ORAL | 0 refills | Status: DC

## 2019-09-22 MED ORDER — ACETAMINOPHEN 325 MG PO TABS: 650.0000 mg | ORAL_TABLET | Freq: Four times a day (QID) | ORAL | 0 refills | Status: DC | PRN

## 2019-09-22 MED ORDER — DM-GUAIFENESIN ER 30-600 MG PO TB12: 1.0000 | ORAL_TABLET | Freq: Two times a day (BID) | ORAL | 0 refills | Status: DC

## 2019-09-22 NOTE — Discharge Summary (Signed)
Physician Discharge Summary  Philip Houston HAL:[id number] DOB: [birth year] DOA: 09/18/2019  PCP: McLean-Scocuzza, Nino Glow, MD  Admit date: 09/18/2019 Discharge date: 09/22/2019  Time spent: 30 minutes  Recommendations for Outpatient Follow-up:   Sepsis/community-acquired pneumonia -On admission met criteria for sepsis RR > 20, HR > 90, positive lactic acidosis, with endorgan damage. -Complete 7-day course antibiotics -5/27 lactic acid, procalcitonin negative, . 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 -Cultures NGTD -5/28 PCXR; show some worsening however patient's overall clinical picture significantly improved.  See results below -Does not meet guidelines for home O2   Acute  LEFT pulmonary Embolism CT PE protocol was performed given patient's complaint of chest pain and no prior onset of respiratory illness or infection symptoms until arrival to ER. CT PE protocol is positive for left-sided pulmonary embolism as well as possible right-sided pulmonary embolism. We will start the patient on Eliquis after discussion regarding various option. Hypercoagulable work-up recommended. 3 months anticoagulation given provoked event. Patient was driving for 4 hours prior to arrival to ER. -Follow-up with Dr. 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 Caryl Comes. Continue to monitor. Avoid QT prolonging medications.   Discharge Diagnoses:  Principal Problem:   CAP (community acquired pneumonia) Active Problems:   Brugada syndrome   Discharge Condition: Stable  Diet recommendation: Regular  Filed  Weights   09/20/19 0421 09/21/19 0412 09/22/19 0445  Weight: 93 kg 92.7 kg 92.1 kg    History of present illness:  29 year old WM  PMHx  Brugada type I syndrome.   Presents with sudden onset of chest pain associated with shortness of breath. Found to have community-acquired pneumonia as well as pulmonary embolism. Currently further plan iscontinue IV antibiotics monitor culture  Hospital Course:  See above  Procedures: 5/25 CTA chest PE protocol;-suspected subsegmental pulmonary emboli at least within the left lower lobe.  -The nonvisualized subsegmental right lower lobe pulmonary artery is also possible. -Subpleural ground-glass opacities in the bilateral lower lobes suggest developing pulmonary infarcts. -Associated bilateral lower lobe atelectasis with small bilateral pleural effusions, right greater than left. 5/27 PCXR;I-ncreasing bilateral pleural effusions now with fluid tracking along the right fissures. - Adjacent areas of opacity in both lung bases likely reflect at least some passive atelectatic change. Difficult to distinguish areas of superimposed airspace disease in either lung base and previously suspected pulmonary infarct in the left lower lobe    Cultures  5/24 SARS coronavirus negative 5/24 blood left AC NGTD 5/24 blood LEFT hand NGTD 5/24 strep pneumo/Legionella urine antigen negative 5/26 respiratory virus panel negative  Antibiotics Anti-infectives (From admission, onward)   Start     Dose/Rate Stop   09/22/19 1000  cefixime (SUPRAX) capsule 400 mg     400 mg     09/22/19 0000  cefixime (SUPRAX) 400 MG CAPS capsule     400 mg     09/22/19 0000  doxycycline (VIBRA-TABS) 100 MG tablet     100 mg     09/20/19 2200  doxycycline (VIBRA-TABS) tablet 100 mg     100 mg     09/19/19 1000  cefTRIAXone (ROCEPHIN) 1 g in sodium chloride 0.9 %  100 mL IVPB  Status:  Discontinued     1 g 200 mL/hr over 30 Minutes 09/22/19 0908   09/19/19 0800  doxycycline  (VIBRAMYCIN) 100 mg in sodium chloride 0.9 % 250 mL IVPB  Status:  Discontinued     100 mg 125 mL/hr over 120 Minutes 09/20/19 0932   09/18/19 0815  cefTRIAXone (ROCEPHIN) 1 g in sodium chloride 0.9 % 100 mL IVPB     1 g 200 mL/hr over 30 Minutes 09/18/19 0908   09/18/19 0815  azithromycin (ZITHROMAX) 500 mg in sodium chloride 0.9 % 250 mL IVPB     500 mg 250 mL/hr over 60 Minutes 09/18/19 0936       Discharge Exam: Vitals:   09/21/19 1821 09/21/19 1935 09/22/19 0445 09/22/19 0718  BP:  (!) 130/91 122/83 122/76  Pulse:  93 79 79  Resp:  _0 Temp: (!) 101.5 F (38.6 C) (!) 100.8 F (38.2 C) 99.9 F (37.7 C) 98.6 F (37 C)  TempSrc: Oral   Oral  SpO2:  94% 94% 96%  Weight:   92.1 kg   Height:        General:/O x4, no acute respiratory distress Eyes: negative scleral hemorrhage, negative anisocoria, negative icterus ENT: Negative Runny nose, negative gingival bleeding, Neck:  Negative scars, masses, torticollis, lymphadenopathy, JVD Lungs: Clear to auscultation bilaterally without wheezes or crackles Cardiovascular: Regular rate and rhythm without murmur gallop or rub normal S1 and S2  Discharge Instructions   Allergies as of 09/22/2019      Reactions   Sulfa Antibiotics Rash   "when I was a kid" rash      Medication List    TAKE these medications   acetaminophen 325 MG tablet Commonly known as: TYLENOL Take 2 tablets (650 mg total) by mouth every 6 (six) hours as needed for mild pain or fever.   albuterol 108 (90 Base) MCG/ACT inhaler Commonly known as: VENTOLIN HFA Inhale 2 puffs into the lungs every 4 (four) hours as needed for wheezing or shortness of breath.   apixaban 5 MG Tabs tablet Commonly known as: ELIQUIS Take 2 tablets (10 mg total) by mouth 2 (two) times daily.   apixaban 5 MG Tabs tablet Commonly known as: ELIQUIS Take 1 tablet (5 mg total) by mouth 2 (two) times daily. Start taking on: September 26, 2019   cefixime 400 MG Caps  capsule Commonly known as: SUPRAX Take 1 capsule (400 mg total) by mouth daily.   dextromethorphan-guaiFENesin 30-600 MG 12hr tablet Commonly known as: MUCINEX DM Take 1 tablet by mouth 2 (two) times daily.   doxycycline 100 MG tablet Commonly known as: VIBRA-TABS Take 1 tablet (100 mg total) by mouth every 12 (twelve) hours.   oxyCODONE-acetaminophen 5-325 MG tablet Commonly known as: PERCOCET/ROXICET Take 1 tablet by mouth every 4 (four) hours as needed for moderate pain.      Allergies  Allergen Reactions  . Sulfa Antibiotics Rash    "when I was a kid" rash       The results of significant diagnostics from this hospitalization (including imaging, microbiology, ancillary and laboratory) are listed below for reference.    Significant Diagnostic Studies: CT ANGIO CHEST PE W OR WO CONTRAST  Addendum Date: 09/19/2019   ADDENDUM REPORT: 09/19/2019 12:16 ADDENDUM: Critical Value/emergent results were called by telephone at the time of interpretation on 09/19/2019 at 12:05 pm to provider [facility] PATEL , who verbally acknowledged these results. Electronically Signed   By: Bertis Ruddy  [place] Pink M.D.   On: 09/19/2019 12:16   Result Date: 09/19/2019 CLINICAL DATA:  Shortness of breath, chest pain, history of Brugada syndrome type 1 EXAM: CT ANGIOGRAPHY CHEST WITH CONTRAST TECHNIQUE: Multidetector CT imaging of the chest was performed using the standard protocol during bolus administration of intravenous contrast. Multiplanar CT image reconstructions and MIPs were obtained to evaluate the vascular anatomy. CONTRAST:  1m OMNIPAQUE IOHEXOL 350 MG/ML SOLN COMPARISON:  Chest radiograph dated 09/18/2019 FINDINGS: Cardiovascular: Satisfactory opacification of the bilateral pulmonary arteries to the segmental level. Subsegmental level is poorly evaluated, particularly in the bilateral lower lobes, due to respiratory motion. However, subsegmental pulmonary emboli are suspected in the left lower lobe  (series 5/images 194 and 201), equivocal, but supported by coronal MIP imaging (series 9/image 56). As such, this study is considered positive. Subsegmental right lower lobe pulmonary arteries are poorly evaluated/obscured. Although not tailored for evaluation of the thoracic aorta, there is no evidence of thoracic aortic aneurysm or dissection. Heart is normal in size.  No pericardial effusion. Mediastinum/Nodes: No suspicious mediastinal lymphadenopathy. Visualized thyroid is unremarkable. Lungs/Pleura: Mild patchy subpleural ground-glass opacities in the left lower lobe (series 6/image 71), likely reflecting developing pulmonary infarcts. Additional dependent atelectasis with trace left pleural effusion. Small right pleural effusion with compressive atelectasis in the right lower lobe. Additional subpleural ground-glass opacity in the anterior right lower lobe (series 6/image 59) raises the possibility of a developing pulmonary infarct in this region, suggesting a nonvisualized subsegmental right lower lobe pulmonary embolism. No suspicious pulmonary nodules. No frank interstitial edema. No pneumothorax. Upper Abdomen: Visualized upper abdomen is grossly unremarkable. Musculoskeletal: Visualized osseous structures are within normal limits. Review of the MIP images confirms the above findings. IMPRESSION: Suspected subsegmental pulmonary emboli at least within the left lower lobe. The nonvisualized subsegmental right lower lobe pulmonary artery is also possible. Subpleural ground-glass opacities in the bilateral lower lobes suggest developing pulmonary infarcts. Associated bilateral lower lobe atelectasis with small bilateral pleural effusions, right greater than left. Electronically Signed: By: SJulian HyM.D. On: 09/19/2019 12:04   UK[place]Venous Img Lower Bilateral (DVT)  Result Date: 09/20/2019 CLINICAL DATA:  Bilateral lower extremity pain and edema. Recent diagnosis of pulmonary embolism. Evaluate for  lower extremity DVT. EXAM: BILATERAL LOWER EXTREMITY VENOUS DOPPLER ULTRASOUND TECHNIQUE: Gray-scale sonography with graded compression, as well as color Doppler and duplex ultrasound were performed to evaluate the lower extremity deep venous systems from the level of the common femoral vein and including the common femoral, femoral, profunda femoral, popliteal and calf veins including the posterior tibial, peroneal and gastrocnemius veins when visible. The superficial great saphenous vein was also interrogated. Spectral Doppler was utilized to evaluate flow at rest and with distal augmentation maneuvers in the common femoral, femoral and popliteal veins. COMPARISON:  Chest CT-09/19/2019 FINDINGS: RIGHT LOWER EXTREMITY Common Femoral Vein: No evidence of thrombus. Normal compressibility, respiratory phasicity and response to augmentation. Saphenofemoral Junction: No evidence of thrombus. Normal compressibility and flow on color Doppler imaging. Profunda Femoral Vein: No evidence of thrombus. Normal compressibility and flow on color Doppler imaging. Femoral Vein: No evidence of thrombus. Normal compressibility, respiratory phasicity and response to augmentation. Popliteal Vein: No evidence of thrombus. Normal compressibility, respiratory phasicity and response to augmentation. Calf Veins: No evidence of thrombus. Normal compressibility and flow on color Doppler imaging. Superficial Great Saphenous Vein: No evidence of thrombus. Normal compressibility. Venous Reflux:  None. Other Findings:  None. LEFT LOWER EXTREMITY Common Femoral Vein: No evidence of thrombus. Normal compressibility, respiratory  phasicity and response to augmentation. Saphenofemoral Junction: No evidence of thrombus. Normal compressibility and flow on color Doppler imaging. Profunda Femoral Vein: No evidence of thrombus. Normal compressibility and flow on color Doppler imaging. Femoral Vein: No evidence of thrombus. Normal compressibility,  respiratory phasicity and response to augmentation. Popliteal Vein: No evidence of thrombus. Normal compressibility, respiratory phasicity and response to augmentation. Calf Veins: No evidence of thrombus. Normal compressibility and flow on color Doppler imaging. Superficial Great Saphenous Vein: No evidence of thrombus. Normal compressibility. Venous Reflux:  None. Other Findings:  None. IMPRESSION: No evidence of DVT within either lower extremity. Electronically Signed   By: Sandi Mariscal M.D.   On: 09/20/2019 11:42   DG Chest Port 1 View  Result Date: 09/22/2019 CLINICAL DATA:  Pneumonia EXAM: PORTABLE CHEST 1 VIEW COMPARISON:  CTA 09/19/2019, radiograph 09/18/2019 FINDINGS: Redemonstration of bilateral effusions which is slightly larger on the right including fluid tracking in the fissure. Adjacent areas of opacity in both lung bases likely reflect some passive atelectatic change. Difficult to distinguish areas of suspected pulmonary infarct in the left lower lobe. Additional underlying consolidative opacity in the right lung base is difficult to exclude. No pneumothorax. Cardiomediastinal contours are similar to comparison radiography accounting for differences in technique. No acute osseous or soft tissue abnormality. Telemetry leads overlie the chest. IMPRESSION: 1. Increasing bilateral pleural effusions now with fluid tracking along the right fissures. 2. Adjacent areas of opacity in both lung bases likely reflect at least some passive atelectatic change. Difficult to distinguish areas of superimposed airspace disease in either lung base and previously suspected pulmonary infarct in the left lower lobe. Electronically Signed   By: Lovena Le M.D.   On: 09/22/2019 06:52   DG Chest Portable 1 View  Result Date: 09/18/2019 CLINICAL DATA:  Shortness of breath and fever EXAM: PORTABLE CHEST 1 VIEW COMPARISON:  December 23, 2016 FINDINGS: There is ill-defined airspace opacity in the right base with  questionable small right pleural effusion. Lungs elsewhere clear. Heart size and pulmonary vascularity are normal. No adenopathy. No bone lesions. IMPRESSION: Apparent pneumonia right base with questionable small right pleural effusion. Lungs elsewhere clear. Cardiac silhouette within normal limits. No adenopathy. Electronically Signed   By: Lowella Grip III M.D.   On: 09/18/2019 07:55   ECHOCARDIOGRAM COMPLETE  Result Date: 09/21/2019    ECHOCARDIOGRAM REPORT   Patient Name:   AVEER BARTOW Date of Exam: 09/20/2019 Medical Rec #:  [id number]   Height:       72.0 in Accession #:    [id number]  Weight:       205.0 lb Date of Birth:  [birth year]   BSA:          2.153 m Patient Age:    28 years    BP:           110/70 mmHg Patient Gender: M           HR:           86 bpm. Exam Location:  [facility] Procedure: 2D Echo, Cardiac Doppler and Color Doppler Indications:     Pulmonary embolus  History:         Patient has prior history of Echocardiogram examinations, most                  recent 01/25/2017. Abnormal ECG. Brugada Syndrome.  Sonographer:     Wilford Sports Rodgers-Jones Referring Phys:  [id number] [place] Diagnosing Phys: Kate Sable MD IMPRESSIONS  1.  Left ventricular ejection fraction, by estimation, is 60 to 65%. The left ventricle has normal function. The left ventricle has no regional wall motion abnormalities. Left ventricular diastolic parameters were normal.  2. Right ventricular systolic function is normal. The right ventricular size is moderately enlarged. There is normal pulmonary artery systolic pressure.  3. The mitral valve is normal in structure. No evidence of mitral valve regurgitation. No evidence of mitral stenosis.  4. The aortic valve is normal in structure. Aortic valve regurgitation is not visualized. No aortic stenosis is present.  5. The inferior vena cava is normal in size with greater than 50% respiratory variability, suggesting right atrial pressure of 3 mmHg. FINDINGS  Left  Ventricle: Left ventricular ejection fraction, by estimation, is 60 to 65%. The left ventricle has normal function. The left ventricle has no regional wall motion abnormalities. The left ventricular internal cavity size was normal in size. There is  no left ventricular hypertrophy. Left ventricular diastolic parameters were normal. Right Ventricle: The right ventricular size is moderately enlarged. No increase in right ventricular wall thickness. Right ventricular systolic function is normal. There is normal pulmonary artery systolic pressure. The tricuspid regurgitant velocity is 2.16 m/s, and with an assumed right atrial pressure of 3 mmHg, the estimated right ventricular systolic pressure is 86.7 mmHg. Left Atrium: Left atrial size was normal in size. Right Atrium: Right atrial size was normal in size. Pericardium: There is no evidence of pericardial effusion. Mitral Valve: The mitral valve is normal in structure. Normal mobility of the mitral valve leaflets. No evidence of mitral valve regurgitation. No evidence of mitral valve stenosis. Tricuspid Valve: The tricuspid valve is normal in structure. Tricuspid valve regurgitation is not demonstrated. No evidence of tricuspid stenosis. Aortic Valve: The aortic valve is normal in structure. Aortic valve regurgitation is not visualized. No aortic stenosis is present. Pulmonic Valve: The pulmonic valve was normal in structure. Pulmonic valve regurgitation is not visualized. No evidence of pulmonic stenosis. Aorta: The aortic root is normal in size and structure. Venous: The inferior vena cava is normal in size with greater than 50% respiratory variability, suggesting right atrial pressure of 3 mmHg. IAS/Shunts: No atrial level shunt detected by color flow Doppler.  LEFT VENTRICLE PLAX 2D LVIDd:         4.49 cm  Diastology LVIDs:         2.75 cm  LV e' lateral:   6.96 cm/s LV PW:         0.82 cm  LV E/e' lateral: 12.4 LV IVS:        0.95 cm  LV e' medial:    9.25 cm/s  LVOT diam:     2.00 cm  LV E/e' medial:  9.4 LV SV:         43 LV SV Index:   20 LVOT Area:     3.14 cm  RIGHT VENTRICLE RV Basal diam:  4.50 cm RV S prime:     20.30 cm/s TAPSE (M-mode): 2.8 cm LEFT ATRIUM             Index       RIGHT ATRIUM           Index LA diam:        4.00 cm 1.86 cm/m  RA Area:     18.80 cm LA Vol (A2C):   29.1 ml 13.52 ml/m RA Volume:   62.20 ml  28.89 ml/m LA Vol (A4C):   41.9 ml 19.46 ml/m LA  Biplane Vol: 37.6 ml 17.46 ml/m  AORTIC VALVE LVOT Vmax:   88.10 cm/s LVOT Vmean:  62.100 cm/s LVOT VTI:    0.137 m  AORTA Ao Root diam: 3.20 cm MITRAL VALVE               TRICUSPID VALVE MV Area (PHT): 5.13 cm    TR Peak grad:   18.7 mmHg MV Decel Time: 148 msec    TR Vmax:        216.00 cm/s MV E velocity: 86.50 cm/s MV A velocity: 56.10 cm/s  SHUNTS MV E/A ratio:  1.54        Systemic VTI:  0.14 m                            Systemic Diam: 2.00 cm Kate Sable MD Electronically signed by Kate Sable MD Signature Date/Time: 09/21/2019/8:11:51 AM    Final     Microbiology: Recent Results (from the past 240 hour(s))  Blood culture (routine x 2)     Status: None (Preliminary result)   Collection Time: 09/18/19  8:07 AM   Specimen: BLOOD  Result Value Ref Range Status   Specimen Description BLOOD LEFT [facility]  Final   Special Requests   Final    BOTTLES DRAWN AEROBIC AND ANAEROBIC Blood Culture adequate volume   Culture   Final    NO GROWTH 4 DAYS Performed at [facility], [address] [place], [place] [address]    Report Status PENDING  Incomplete  Blood culture (routine x 2)     Status: None (Preliminary result)   Collection Time: 09/18/19  8:07 AM   Specimen: BLOOD  Result Value Ref Range Status   Specimen Description BLOOD LEFT HAND  Final   Special Requests   Final    BOTTLES DRAWN AEROBIC AND ANAEROBIC Blood Culture adequate volume   Culture   Final    NO GROWTH 4 DAYS Performed at [facility], [address] [place], [place]  [address]    Report Status PENDING  Incomplete  SARS Coronavirus 2 by RT PCR (hospital order, performed in [place] hospital lab) Nasopharyngeal Nasopharyngeal Swab     Status: None   Collection Time: 09/18/19  9:32 AM   Specimen: Nasopharyngeal Swab  Result Value Ref Range Status   SARS Coronavirus 2 NEGATIVE NEGATIVE Final    Comment: (NOTE) SARS-CoV-2 target nucleic acids are NOT DETECTED. The SARS-CoV-2 RNA is generally detectable in upper and lower respiratory specimens during the acute phase of infection. The lowest concentration of SARS-CoV-2 viral copies this assay can detect is 250 copies / mL. A negative result does not preclude SARS-CoV-2 infection and should not be used as the sole basis for treatment or other patient management decisions.  A negative result may occur with improper specimen collection / handling, submission of specimen other than nasopharyngeal swab, presence of viral mutation(s) within the areas targeted by this assay, and inadequate number of viral copies (<250 copies / mL). A negative result must be combined with clinical observations, patient history, and epidemiological information. Fact Sheet for Patients:   StrictlyIdeas.no Fact Sheet for Healthcare Providers: BankingDealers.co.za This test is not yet approved or cleared  by the [place] FDA and has been authorized for detection and/or diagnosis of SARS-CoV-2 by FDA under an Emergency Use Authorization (EUA).  This EUA will remain in effect (meaning this test can be used) for the duration of the  COVID-19 declaration under Section 564(b)(1) of the Act, 21 U.S.C. section 360bbb-3(b)(1), unless the authorization is terminated or revoked sooner. Performed at [facility], [address] [place], [place] [address]   Respiratory Panel by PCR     Status: None   Collection Time: 09/20/19  9:10 PM  Result Value Ref Range Status   Adenovirus  NOT DETECTED NOT DETECTED Final   Coronavirus 229E NOT DETECTED NOT DETECTED Final    Comment: (NOTE) The Coronavirus on the Respiratory Panel, DOES NOT test for the novel  Coronavirus (2019 nCoV)    Coronavirus HKU1 NOT DETECTED NOT DETECTED Final   Coronavirus NL63 NOT DETECTED NOT DETECTED Final   Coronavirus OC43 NOT DETECTED NOT DETECTED Final   Metapneumovirus NOT DETECTED NOT DETECTED Final   Rhinovirus / Enterovirus NOT DETECTED NOT DETECTED Final   Influenza A NOT DETECTED NOT DETECTED Final   Influenza B NOT DETECTED NOT DETECTED Final   Parainfluenza Virus 1 NOT DETECTED NOT DETECTED Final   Parainfluenza Virus 2 NOT DETECTED NOT DETECTED Final   Parainfluenza Virus 3 NOT DETECTED NOT DETECTED Final   Parainfluenza Virus 4 NOT DETECTED NOT DETECTED Final   Respiratory Syncytial Virus NOT DETECTED NOT DETECTED Final   Bordetella pertussis NOT DETECTED NOT DETECTED Final   Chlamydophila pneumoniae NOT DETECTED NOT DETECTED Final   Mycoplasma pneumoniae NOT DETECTED NOT DETECTED Final    Comment: Performed at [facility], [place] [address] [place], [place] [address]     Labs: Basic Metabolic Panel: Recent Labs  Lab 09/18/19 0728 09/20/19 0610 09/21/19 0443 09/22/19 0447  NA 138 137 138 139  K 3.7 3.9 3.9 4.1  CL 103 103 102 105  CO2 _0 GLUCOSE 151* 110* 107* 100*  BUN _1 CREATININE 1.08 0.90 0.87 0.95  CALCIUM 9.4 9.2 9.1 9.2  MG 2.1 1.7 1.7 1.9  PHOS  --   --  3.0 3.9   Liver Function Tests: Recent Labs  Lab 09/18/19 0728 09/20/19 0610 09/21/19 0443 09/22/19 0447  AST _2 ALT 45* 26 23 39  ALKPHOS 84 66 67 71  BILITOT 1.3* 1.2 1.0 0.7  PROT 8.6* 7.4 7.0 7.1  ALBUMIN 4.9 3.7 3.5 3.5   No results for input(s): LIPASE, AMYLASE in the last 168 hours. No results for input(s): AMMONIA in the last 168 hours. CBC: Recent Labs  Lab 09/18/19 0728 09/20/19 0610 09/21/19 0443 09/22/19 0447  WBC 12.3* 10.5 8.2 7.1   NEUTROABS  --  8.3*  --   --   HGB 15.9 14.0 13.5 13.5  HCT 45.0 40.3 39.0 38.0*  MCV 83.6 86.3 86.5 84.4  PLT 313 241 266 305   Cardiac Enzymes: No results for input(s): CKTOTAL, CKMB, CKMBINDEX, TROPONINI in the last 168 hours. BNP: BNP (last 3 results) No results for input(s): BNP in the last 8760 hours.  ProBNP (last 3 results) No results for input(s): PROBNP in the last 8760 hours.  CBG: No results for input(s): GLUCAP in the last 168 hours.     Signed:  Dia Crawford, MD [facility] [phone] pager

## 2019-09-22 NOTE — Telephone Encounter (Signed)
Pt said he will be discharged today. He is currently at [facility]. I scheduled him an appt for 09/27/19.

## 2019-09-22 NOTE — Telephone Encounter (Signed)
Noted. Patient does not qualify for TCM due to [facility] standard insurance. Thank you for scheduling hospital follow up.

## 2019-09-22 NOTE — Progress Notes (Signed)
09-22-19:  Patient stats maintained in mid 90s on room air. Complains of some fatigue. Wife, Marchelle Folks, at bedside throughout the night. Eager for discharge.

## 2019-09-22 NOTE — Plan of Care (Signed)
Discharge instructions provided to pt.  All questions addressed.  Understanding verified through teach back.  Awaiting transportation home via POV. ° ° °Problem: Education: °Goal: Knowledge of General Education information will improve °Description: Including pain rating scale, medication(s)/side effects and non-pharmacologic comfort measures °Outcome: Adequate for Discharge °  °Problem: Health Behavior/Discharge Planning: °Goal: Ability to manage health-related needs will improve °Outcome: Adequate for Discharge °  °Problem: Clinical Measurements: °Goal: Ability to maintain clinical measurements within normal limits will improve °Outcome: Adequate for Discharge °Goal: Will remain free from infection °Outcome: Adequate for Discharge °Goal: Diagnostic test results will improve °Outcome: Adequate for Discharge °Goal: Respiratory complications will improve °Outcome: Adequate for Discharge °Goal: Cardiovascular complication will be avoided °Outcome: Adequate for Discharge °  °Problem: Activity: °Goal: Risk for activity intolerance will decrease °Outcome: Adequate for Discharge °  °Problem: Nutrition: °Goal: Adequate nutrition will be maintained °Outcome: Adequate for Discharge °  °Problem: Coping: °Goal: Level of anxiety will decrease °Outcome: Adequate for Discharge °  °Problem: Elimination: °Goal: Will not experience complications related to bowel motility °Outcome: Adequate for Discharge °Goal: Will not experience complications related to urinary retention °Outcome: Adequate for Discharge °  °Problem: Pain Managment: °Goal: General experience of comfort will improve °Outcome: Adequate for Discharge °  °Problem: Safety: °Goal: Ability to remain free from injury will improve °Outcome: Adequate for Discharge °  °Problem: Skin Integrity: °Goal: Risk for impaired skin integrity will decrease °Outcome: Adequate for Discharge °  °

## 2019-09-22 NOTE — Discharge Instructions (Signed)
Community-Acquired Pneumonia, Adult Pneumonia is an infection of the lungs. It causes swelling in the airways of the lungs. Mucus and fluid may also build up inside the airways. One type of pneumonia can happen while a person is in a hospital. A different type can happen when a person is not in a hospital (community-acquired pneumonia).  What are the causes?  This condition is caused by germs (viruses, bacteria, or fungi). Some types of germs can be passed from one person to another. This can happen when you breathe in droplets from the cough or sneeze of an infected person. What increases the risk? You are more likely to develop this condition if you:  Have a long-term (chronic) disease, such as: ? Chronic obstructive pulmonary disease (COPD). ? Asthma. ? Cystic fibrosis. ? Congestive heart failure. ? Diabetes. ? Kidney disease.  Have HIV.  Have sickle cell disease.  Have had your spleen removed.  Do not take good care of your teeth and mouth (poor dental hygiene).  Have a medical condition that increases the risk of breathing in droplets from your own mouth and nose.  Have a weakened body defense system (immune system).  Are a smoker.  Travel to areas where the germs that cause this illness are common.  Are around certain animals or the places they live. What are the signs or symptoms?  A dry cough.  A wet (productive) cough.  Fever.  Sweating.  Chest pain. This often happens when breathing deeply or coughing.  Fast breathing or trouble breathing.  Shortness of breath.  Shaking chills.  Feeling tired (fatigue).  Muscle aches. How is this treated? Treatment for this condition depends on many things. Most adults can be treated at home. In some cases, treatment must happen in a hospital. Treatment may include:  Medicines given by mouth or through an IV tube.  Being given extra oxygen.  Respiratory therapy. In rare cases, treatment for very bad pneumonia  may include:  Using a machine to help you breathe.  Having a procedure to remove fluid from around your lungs. Follow these instructions at home: Medicines  Take over-the-counter and prescription medicines only as told by your doctor. ? Only take cough medicine if you are losing sleep.  If you were prescribed an antibiotic medicine, take it as told by your doctor. Do not stop taking the antibiotic even if you start to feel better. General instructions   Sleep with your head and neck raised (elevated). You can do this by sleeping in a recliner or by putting a few pillows under your head.  Rest as needed. Get at least 8 hours of sleep each night.  Drink enough water to keep your pee (urine) pale yellow.  Eat a healthy diet that includes plenty of vegetables, fruits, whole grains, low-fat dairy products, and lean protein.  Do not use any products that contain nicotine or tobacco. These include cigarettes, e-cigarettes, and chewing tobacco. If you need help quitting, ask your doctor.  Keep all follow-up visits as told by your doctor. This is important. How is this prevented? A shot (vaccine) can help prevent pneumonia. Shots are often suggested for:  People older than 31 years of age.  People older than 31 years of age who: ? Are having cancer treatment. ? Have long-term (chronic) lung disease. ? Have problems with their body's defense system. You may also prevent pneumonia if you take these actions:  Get the flu (influenza) shot every year.  Go to the dentist as   often as told.  Wash your hands often. If you cannot use soap and water, use hand sanitizer. Contact a doctor if:  You have a fever.  You lose sleep because your cough medicine does not help. Get help right away if:  You are short of breath and it gets worse.  You have more chest pain.  Your sickness gets worse. This is very serious if: ? You are an older adult. ? Your body's defense system is weak.  You  cough up blood. Summary  Pneumonia is an infection of the lungs.  Most adults can be treated at home. Some will need treatment in a hospital.  Drink enough water to keep your pee pale yellow.  Get at least 8 hours of sleep each night. This information is not intended to replace advice given to you by your health care provider. Make sure you discuss any questions you have with your health care provider. Document Revised: 08/03/2018 Document Reviewed: 12/09/2017 Elsevier Patient Education  2020 [employer]  

## 2019-09-23 ENCOUNTER — Encounter: Payer: Self-pay | Admitting: Internal Medicine

## 2019-09-23 LAB — LUPUS ANTICOAGULANT PANEL
DRVVT: 52.1 s — ABNORMAL HIGH (ref 0.0–47.0)
PTT Lupus Anticoagulant: 47.5 s (ref 0.0–51.9)

## 2019-09-23 LAB — CULTURE, BLOOD (ROUTINE X 2)
Culture: NO GROWTH
Culture: NO GROWTH
Special Requests: ADEQUATE
Special Requests: ADEQUATE

## 2019-09-23 LAB — DRVVT CONFIRM: dRVVT Confirm: 0.9 ratio (ref 0.8–1.2)

## 2019-09-23 LAB — DRVVT MIX: dRVVT Mix: 43.1 s — ABNORMAL HIGH (ref 0.0–40.4)

## 2019-09-26 ENCOUNTER — Other Ambulatory Visit: Payer: Self-pay | Admitting: Internal Medicine

## 2019-09-26 ENCOUNTER — Telehealth: Payer: Self-pay | Admitting: Internal Medicine

## 2019-09-26 DIAGNOSIS — J189 Pneumonia, unspecified organism: Secondary | ICD-10-CM

## 2019-09-26 LAB — FACTOR 5 LEIDEN

## 2019-09-26 MED ORDER — DOXYCYCLINE HYCLATE 100 MG PO TABS: 100.0000 mg | ORAL_TABLET | Freq: Two times a day (BID) | ORAL | 0 refills | Status: DC

## 2019-09-26 MED ORDER — CEFIXIME 400 MG PO CAPS: 400.0000 mg | ORAL_CAPSULE | Freq: Every day | ORAL | 0 refills | Status: DC

## 2019-09-26 NOTE — Telephone Encounter (Signed)
Placed on your desk. 

## 2019-09-26 NOTE — Telephone Encounter (Signed)
Take 1 tablet (100 mg total) by mouth every 12 (twelve) hours., Starting Fri 09/22/2019, Print  Dispense: 3 tablet   Please advise

## 2019-09-26 NOTE — Telephone Encounter (Signed)
Patient dropped off FMLA paper work, he has a Multimedia programmer with Dr. Shirlee Latch tomorrow. Paper work is up front in [employer]

## 2019-09-27 ENCOUNTER — Telehealth (INDEPENDENT_AMBULATORY_CARE_PROVIDER_SITE_OTHER): Payer: BC Managed Care – PPO | Admitting: Internal Medicine

## 2019-09-27 ENCOUNTER — Encounter: Payer: Self-pay | Admitting: Internal Medicine

## 2019-09-27 ENCOUNTER — Telehealth: Payer: Self-pay | Admitting: Internal Medicine

## 2019-09-27 VITALS — Ht 72.0 in | Wt 200.0 lb

## 2019-09-27 DIAGNOSIS — J189 Pneumonia, unspecified organism: Secondary | ICD-10-CM | POA: Diagnosis not present

## 2019-09-27 DIAGNOSIS — R791 Abnormal coagulation profile: Secondary | ICD-10-CM | POA: Insufficient documentation

## 2019-09-27 DIAGNOSIS — E559 Vitamin D deficiency, unspecified: Secondary | ICD-10-CM

## 2019-09-27 DIAGNOSIS — I2699 Other pulmonary embolism without acute cor pulmonale: Secondary | ICD-10-CM | POA: Diagnosis not present

## 2019-09-27 DIAGNOSIS — Z1389 Encounter for screening for other disorder: Secondary | ICD-10-CM

## 2019-09-27 DIAGNOSIS — J90 Pleural effusion, not elsewhere classified: Secondary | ICD-10-CM | POA: Diagnosis not present

## 2019-09-27 DIAGNOSIS — Z13220 Encounter for screening for lipoid disorders: Secondary | ICD-10-CM

## 2019-09-27 DIAGNOSIS — Z1329 Encounter for screening for other suspected endocrine disorder: Secondary | ICD-10-CM

## 2019-09-27 MED ORDER — APIXABAN 5 MG PO TABS: 5.0000 mg | ORAL_TABLET | Freq: Two times a day (BID) | ORAL | 0 refills | Status: DC

## 2019-09-27 NOTE — Progress Notes (Addendum)
Virtual Visit via Video Note  I connected with Philip Houston  on 09/27/19 at 10:20 AM EDT by a video enabled telemedicine application and verified that I am speaking with the correct person using two identifiers.  Location patient: home Location provider:work or home office Persons participating in the virtual visit: patient, provider, pts wife  I discussed the limitations of evaluation and management by telemedicine and the availability of in person appointments. The patient expressed understanding and agreed to proceed.   HPI: HFU at [facility] 09/18/19 to 09/12/19 for sepsis CAP and left acute PE and right PE as well with DRVVT, elevated and b/l pleural effusions pt agreeable to see pulm . No h/o smoking/vapping. He is feeling better. No current fever, sob taking mucinex dm 30/600 mg otc and doxycycline 100 mg bid and cefixime 400 mg qd and eliquis 10 mg bid x 7 days transition to 5 mg bid doing well  Respiratory virus panel neg and strep pneumo and legionella negative  [place] legs negative DVT b/l  Echo EF 60-65 %   He needs FMLA paperwork filled out and note to return to work as [employer] [employer] 10/09/19.     ROS: See pertinent positives and negatives per HPI.  Past Medical History:  Diagnosis Date  . Abnormal ECG    Brugada Type 1 pattern  . Brugada syndrome    01/2017 presented after working out with lightheadedness, heart flutter and irregular HB  . Eczema     Past Surgical History:  Procedure Laterality Date  . LACERATION REPAIR     right thumb 29 y.o     Family History  Problem Relation Age of Onset  . Diabetes Father     SOCIAL HX: lives with wife  Current Outpatient Medications:  .  albuterol (VENTOLIN HFA) 108 (90 Base) MCG/ACT inhaler, Inhale 2 puffs into the lungs every 4 (four) hours as needed for wheezing or shortness of breath., Disp: 6.7 g, Rfl: 0 .  cefixime (SUPRAX) 400 MG CAPS capsule, Take 1 capsule (400 mg total) by mouth daily., Disp: 4 capsule, Rfl: 0 .   dextromethorphan-guaiFENesin (MUCINEX DM) 30-600 MG 12hr tablet, Take 1 tablet by mouth 2 (two) times daily., Disp: 30 tablet, Rfl: 0 .  doxycycline (VIBRA-TABS) 100 MG tablet, Take 1 tablet (100 mg total) by mouth every 12 (twelve) hours. With food, Disp: 8 tablet, Rfl: 0 .  acetaminophen (TYLENOL) 325 MG tablet, Take 2 tablets (650 mg total) by mouth every 6 (six) hours as needed for mild pain or fever. (Patient not taking: Reported on 09/27/2019), Disp: 30 tablet, Rfl: 0 .  apixaban (ELIQUIS) 5 MG TABS tablet, Take 1 tablet (5 mg total) by mouth 2 (two) times daily., Disp: 60 tablet, Rfl: 0 .  oxyCODONE-acetaminophen (PERCOCET/ROXICET) 5-325 MG tablet, Take 1 tablet by mouth every 4 (four) hours as needed for moderate pain. (Patient not taking: Reported on 09/27/2019), Disp: 10 tablet, Rfl: 0  EXAM:  VITALS per patient if applicable:  GENERAL: alert, oriented, appears well and in no acute distress  HEENT: atraumatic, conjunttiva clear, no obvious abnormalities on inspection of external nose and ears  NECK: normal movements of the head and neck  LUNGS: on inspection no signs of respiratory distress, breathing rate appears normal, no obvious gross SOB, gasping or wheezing  CV: no obvious cyanosis  MS: moves all visible extremities without noticeable abnormality  PSYCH/NEURO: pleasant and cooperative, no obvious depression or anxiety, speech and thought processing grossly intact  ASSESSMENT AND PLAN:  Discussed the following assessment and plan:  Acute pulmonary embolism b/l, without acute cor pulmonale present (HCC) with h/o pleural effusions, CAP - Plan: apixaban (ELIQUIS) 5 MG TABS tablet bid, Ambulatory referral to Pulmonology f/u and determine anticoagulation duration and f/u CAP, and f/u elevated drvvt lab  Also consider h/o consult in future Complete 7 days doxycycline 100 mg bid and cefixime 400 mg qd  Filled out FMLA to return to work 10/09/19   Positive dilute Russell's viper  venom time test (DRVVT) Consider h/o consult  -we discussed possible serious and likely etiologies, options for evaluation and workup, limitations of telemedicine visit vs in person visit, treatment, treatment risks and precautions. Pt prefers to treat via telemedicine empirically rather then risking or undertaking an in person visit at this moment. Patient agrees to seek prompt in person care if worsening, new symptoms arise, or if is not improving with treatment.   I discussed the assessment and treatment plan with the patient. The patient was provided an opportunity to ask questions and all were answered. The patient agreed with the plan and demonstrated an understanding of the instructions.   The patient was advised to call back or seek an in-person evaluation if the symptoms worsen or if the condition fails to improve as anticipated.  Time spent 30 minutes Delorise Jackson, MD

## 2019-09-27 NOTE — Telephone Encounter (Signed)
Lm to call office to make a fasting lab appointment in 3 months and a follow up appointment in 3 to 6 months.

## 2019-09-28 NOTE — Telephone Encounter (Signed)
Pt called in checking on letter to return to work he was wanting to pick it up.

## 2019-09-28 NOTE — Telephone Encounter (Signed)
No answer, no voicemail.  Forms have been placed upfront for pick up. Okay to notify patient of this upon return call.

## 2019-10-10 ENCOUNTER — Ambulatory Visit: Payer: BC Managed Care – PPO | Admitting: Pulmonary Disease

## 2019-10-10 ENCOUNTER — Encounter: Payer: Self-pay | Admitting: Pulmonary Disease

## 2019-10-10 ENCOUNTER — Other Ambulatory Visit: Payer: Self-pay

## 2019-10-10 VITALS — BP 120/68 | HR 67 | Temp 97.8°F | Ht 74.0 in | Wt 204.8 lb

## 2019-10-10 DIAGNOSIS — Z86711 Personal history of pulmonary embolism: Secondary | ICD-10-CM

## 2019-10-10 NOTE — Patient Instructions (Signed)
We will refer you to the hematologist to evaluate for any potential issues with your clotting.  We will continue Eliquis for at least 6 months.   We will see you in follow-up in 6 to 8 weeks time call sooner should any new difficulties arise.

## 2019-10-10 NOTE — Progress Notes (Signed)
    Assessment & Plan:  1. History of pulmonary embolus (PE) (Primary) - Ambulatory referral to Hematology / Oncology   Patient Instructions  We will refer you to the hematologist to evaluate for any potential issues with your clotting.  We will continue Eliquis  for at least 6 months.   We will see you in follow-up in 6 to 8 weeks time call sooner should any new difficulties arise.  Please note: late entry documentation due to logistical difficulties during COVID-19 pandemic. This note is filed for information purposes only, and is not intended to be used for billing, nor does it represent the full scope/nature of the visit in question. Please see any associated scanned media linked to date of encounter for additional pertinent information.  Subjective:    HPI: Philip Houston is a 29 y.o. male presenting to the pulmonology clinic on 10/10/2019 with report of: pulmonary consult (per Dr. Rolan-- recent admission 09/18/2019. breathing has improved since discharge- no current sx. )     Outpatient Encounter Medications as of 10/10/2019  Medication Sig   [DISCONTINUED] acetaminophen  (TYLENOL ) 325 MG tablet Take 2 tablets (650 mg total) by mouth every 6 (six) hours as needed for mild pain or fever. (Patient not taking: Reported on 01/05/2020)   [DISCONTINUED] albuterol  (VENTOLIN  HFA) 108 (90 Base) MCG/ACT inhaler Inhale 2 puffs into the lungs every 4 (four) hours as needed for wheezing or shortness of breath. (Patient not taking: Reported on 01/05/2020)   [DISCONTINUED] apixaban  (ELIQUIS ) 5 MG TABS tablet Take 1 tablet (5 mg total) by mouth 2 (two) times daily.   [DISCONTINUED] cefixime  (SUPRAX ) 400 MG CAPS capsule Take 1 capsule (400 mg total) by mouth daily.   [DISCONTINUED] dextromethorphan -guaiFENesin  (MUCINEX  DM) 30-600 MG 12hr tablet Take 1 tablet by mouth 2 (two) times daily.   [DISCONTINUED] doxycycline  (VIBRA -TABS) 100 MG tablet Take 1 tablet (100 mg total) by mouth every 12 (twelve)  hours. With food   [DISCONTINUED] oxyCODONE -acetaminophen  (PERCOCET/ROXICET) 5-325 MG tablet Take 1 tablet by mouth every 4 (four) hours as needed for moderate pain. (Patient not taking: Reported on 01/05/2020)   No facility-administered encounter medications on file as of 10/10/2019.      Objective:   Vitals:   10/10/19 1430  BP: 120/68  Pulse: 67  Temp: 97.8 F (36.6 C)  Height: 6' 2 (1.88 m)  Weight: 204 lb 12.8 oz (92.9 kg)  SpO2: 97%  TempSrc: Temporal  BMI (Calculated): 26.28     Physical exam documentation is limited by delayed entry of information.

## 2019-10-18 ENCOUNTER — Inpatient Hospital Stay: Payer: BC Managed Care – PPO | Attending: Oncology | Admitting: Oncology

## 2019-10-18 ENCOUNTER — Inpatient Hospital Stay: Payer: BC Managed Care – PPO

## 2019-10-18 ENCOUNTER — Encounter: Payer: Self-pay | Admitting: Oncology

## 2019-10-18 ENCOUNTER — Other Ambulatory Visit: Payer: Self-pay

## 2019-10-18 VITALS — BP 121/78 | HR 60 | Temp 97.7°F | Resp 16 | Wt 203.7 lb

## 2019-10-18 DIAGNOSIS — R791 Abnormal coagulation profile: Secondary | ICD-10-CM | POA: Insufficient documentation

## 2019-10-18 DIAGNOSIS — I2699 Other pulmonary embolism without acute cor pulmonale: Secondary | ICD-10-CM | POA: Insufficient documentation

## 2019-10-18 DIAGNOSIS — Z7901 Long term (current) use of anticoagulants: Secondary | ICD-10-CM | POA: Diagnosis not present

## 2019-10-18 NOTE — Progress Notes (Signed)
Hematology/Oncology Consult note [facility] Telephone:([phone] [phone]   Patient Care Team: McLean-Scocuzza, Pasty Spillers, MD as PCP - General (Internal Medicine)  REFERRING PROVIDER: Salena Saner, MD  CHIEF COMPLAINTS/REASON FOR VISIT:  Evaluation of acute pulmonary embolism  HISTORY OF PRESENTING ILLNESS:   Philip Houston is a  29 y.o.  male with PMH listed below was seen in consultation at the request of  Salena Saner, MD  for evaluation of acute pulmonary embolism  Patient presented to emergency room on 09/18/2019 for evaluation of shortness of breath and left shoulder/chest pain. Prior to the onset of acute symptoms, patient had 4-hour round road trip.  When he drove back, he started to notice shoulder/chest pain and his symptoms progressively got worse at night with new onset of shortness of breath.  He presented to the emergency room the next day.  Emergency room he had chills and a temperature of 100.4. Covid 19 was tested and was negative.  Denies any lower extremity calf pain or swelling.  No history of prior DVT. Patient was found to have elected as a level of 1, chest x-ray showed right base infiltration.  Patient was admitted for community acquired pneumonia and was started on Rocephin and doxycycline. CT chest PE protocol showed subsegmental pulmonary emboli at least within the left lower lobe.  The nonvisualized subsegmental right lower lobe pulmonary arteries also possible.  Subpleural groundglass opacities in the bilateral lower lobes suggesting developing pulmonary infarcts.  Associated bilateral lower lobe atelectasis with small bilateral pleural effusions, right greater than left.  Patient was started on IV heparin drip. No lower extremity DVT was discovered. Patient was switched to Eliquis at discharge.  He had hypercoagulable work-up done. -Negative prothrombin gene mutation and factor V Leiden mutation -Negative Cardiolite  pain IgG, IgM, IgA -Normal homocystine level -Normal beta-2 glycoprotein IgG/M/A -Negative lupus anticoagulant.  Prolonged DRV VT. -Normal protein S antigen and activity -Normal protein C antigen and activity -Increased Antithrombin III  Today patient reports tolerating anticoagulation.  No bleeding events. Review of Systems  Constitutional: Negative for appetite change, chills, fatigue, fever and unexpected weight change.  HENT:   Negative for hearing loss and voice change.   Eyes: Negative for eye problems and icterus.  Respiratory: Negative for chest tightness, cough and shortness of breath.   Cardiovascular: Negative for chest pain and leg swelling.  Gastrointestinal: Negative for abdominal distention and abdominal pain.  Endocrine: Negative for hot flashes.  Genitourinary: Negative for difficulty urinating, dysuria and frequency.   Musculoskeletal: Negative for arthralgias.  Skin: Negative for itching and rash.  Neurological: Negative for light-headedness and numbness.  Hematological: Negative for adenopathy. Does not bruise/bleed easily.  Psychiatric/Behavioral: Negative for confusion.    MEDICAL HISTORY:  Past Medical History:  Diagnosis Date  . Abnormal ECG    Brugada Type 1 pattern  . Brugada syndrome    01/2017 presented after working out with lightheadedness, heart flutter and irregular HB  . Eczema   . Pulmonary embolism (HCC)     SURGICAL HISTORY: Past Surgical History:  Procedure Laterality Date  . LACERATION REPAIR     right thumb 29 y.o     SOCIAL HISTORY: Social History   Socioeconomic History  . Marital status: Married    Spouse name: Not on file  . Number of children: Not on file  . Years of education: Not on file  . Highest education level: Not on file  Occupational History  . Not on file  Tobacco Use  . Smoking status: Never Smoker  . Smokeless tobacco: Never Used  Vaping Use  . Vaping Use: Never used  Substance and Sexual Activity  .  Alcohol use: Yes    Comment: socially  . Drug use: No  . Sexual activity: Yes    Partners: Female  Other Topics Concern  . Not on file  Social History Narrative   Transferred from [employer] [employer] in 07/2018 to [place] [employer]    Married no kids     He is middle of 5 kids       [name] [phone]   Social Determinants of Health   Financial Resource Strain:   . Difficulty of Paying Living Expenses:   Food Insecurity:   . Worried About Programme researcher, broadcasting/film/videounning Out of Food in the Last Year:   . Baristaan Out of Food in the Last Year:   Transportation Needs:   . Freight forwarderLack of Transportation (Medical):   Marland Kitchen. Lack of Transportation (Non-Medical):   Physical Activity:   . Days of Exercise per Week:   . Minutes of Exercise per Session:   Stress:   . Feeling of Stress :   Social Connections:   . Frequency of Communication with Friends and Family:   . Frequency of Social Gatherings with Friends and Family:   . Attends Religious Services:   . Active Member of Clubs or Organizations:   . Attends BankerClub or Organization Meetings:   Marland Kitchen. Marital Status:   Intimate Partner Violence:   . Fear of Current or Ex-Partner:   . Emotionally Abused:   Marland Kitchen. Physically Abused:   . Sexually Abused:     FAMILY HISTORY: Family History  Problem Relation Age of Onset  . Diabetes Father   . Arrhythmia Brother   . Hemachromatosis Other        4 paternal uncles    ALLERGIES:  is allergic to sulfa antibiotics.  MEDICATIONS:  Current Outpatient Medications  Medication Sig Dispense Refill  . acetaminophen (TYLENOL) 325 MG tablet Take 2 tablets (650 mg total) by mouth every 6 (six) hours as needed for mild pain or fever. 30 tablet 0  . albuterol (VENTOLIN HFA) 108 (90 Base) MCG/ACT inhaler Inhale 2 puffs into the lungs every 4 (four) hours as needed for wheezing or shortness of breath. 6.7 g 0  . apixaban (ELIQUIS) 5 MG TABS tablet Take 1 tablet (5 mg total) by mouth 2 (two) times daily. 60 tablet 0  .  oxyCODONE-acetaminophen (PERCOCET/ROXICET) 5-325 MG tablet Take 1 tablet by mouth every 4 (four) hours as needed for moderate pain. 10 tablet 0  . triamcinolone cream (KENALOG) 0.1 % Apply 1 application topically 2 (two) times daily. exezema     No current facility-administered medications for this visit.     PHYSICAL EXAMINATION: ECOG PERFORMANCE STATUS: 0 - Asymptomatic Vitals:   10/18/19 1400  BP: 121/78  Pulse: 60  Resp: 16  Temp: 97.7 F (36.5 C)  SpO2: 96%   Filed Weights   10/18/19 1400  Weight: 203 lb 11.2 oz (92.4 kg)    Physical Exam Constitutional:      General: He is not in acute distress. HENT:     Head: Normocephalic and atraumatic.  Eyes:     General: No scleral icterus. Cardiovascular:     Rate and Rhythm: Normal rate and regular rhythm.     Heart sounds: Normal heart sounds.  Pulmonary:     Effort: Pulmonary effort is normal. No  respiratory distress.     Breath sounds: No wheezing.  Abdominal:     General: Bowel sounds are normal. There is no distension.     Palpations: Abdomen is soft.  Musculoskeletal:        General: No deformity. Normal range of motion.     Cervical back: Normal range of motion and neck supple.  Skin:    General: Skin is warm and dry.     Findings: No erythema or rash.  Neurological:     Mental Status: He is alert and oriented to person, place, and time. Mental status is at baseline.     Cranial Nerves: No cranial nerve deficit.     Coordination: Coordination normal.  Psychiatric:        Mood and Affect: Mood normal.     LABORATORY DATA:  I have reviewed the data as listed Lab Results  Component Value Date   WBC 7.1 09/22/2019   HGB 13.5 09/22/2019   HCT 38.0 (L) 09/22/2019   MCV 84.4 09/22/2019   PLT 305 09/22/2019   Recent Labs    09/20/19 0610 09/21/19 0443 09/22/19 0447  NA 137 138 139  K 3.9 3.9 4.1  CL 103 102 105  CO2 27 26 26   GLUCOSE 110* 107* 100*  BUN 7 9 14   CREATININE 0.90 0.87 0.95  CALCIUM  9.2 9.1 9.2  GFRNONAA >60 >60 >60  GFRAA >60 >60 >60  PROT 7.4 7.0 7.1  ALBUMIN 3.7 3.5 3.5  AST 15 16 28   ALT 26 23 39  ALKPHOS 66 67 71  BILITOT 1.2 1.0 0.7   Iron/TIBC/Ferritin/ %Sat No results found for: IRON, TIBC, FERRITIN, IRONPCTSAT    RADIOGRAPHIC STUDIES: I have personally reviewed the radiological images as listed and agreed with the findings in the report. CT ANGIO CHEST PE W OR WO CONTRAST  Addendum Date: 09/19/2019   ADDENDUM REPORT: 09/19/2019 12:16 ADDENDUM: Critical Value/emergent results were called by telephone at the time of interpretation on 09/19/2019 at 12:05 pm to provider [facility] , who verbally acknowledged these results. Electronically Signed   By: 09/21/2019 M.D.   On: 09/19/2019 12:16   Result Date: 09/19/2019 CLINICAL DATA:  Shortness of breath, chest pain, history of Brugada syndrome type 1 EXAM: CT ANGIOGRAPHY CHEST WITH CONTRAST TECHNIQUE: Multidetector CT imaging of the chest was performed using the standard protocol during bolus administration of intravenous contrast. Multiplanar CT image reconstructions and MIPs were obtained to evaluate the vascular anatomy. CONTRAST:  10mL OMNIPAQUE IOHEXOL 350 MG/ML SOLN COMPARISON:  Chest radiograph dated 09/18/2019 FINDINGS: Cardiovascular: Satisfactory opacification of the bilateral pulmonary arteries to the segmental level. Subsegmental level is poorly evaluated, particularly in the bilateral lower lobes, due to respiratory motion. However, subsegmental pulmonary emboli are suspected in the left lower lobe (series 5/images 194 and 201), equivocal, but supported by coronal MIP imaging (series 9/image 56). As such, this study is considered positive. Subsegmental right lower lobe pulmonary arteries are poorly evaluated/obscured. Although not tailored for evaluation of the thoracic aorta, there is no evidence of thoracic aortic aneurysm or dissection. Heart is normal in size.  No pericardial effusion.  Mediastinum/Nodes: No suspicious mediastinal lymphadenopathy. Visualized thyroid is unremarkable. Lungs/Pleura: Mild patchy subpleural ground-glass opacities in the left lower lobe (series 6/image 71), likely reflecting developing pulmonary infarcts. Additional dependent atelectasis with trace left pleural effusion. Small right pleural effusion with compressive atelectasis in the right lower lobe. Additional subpleural ground-glass opacity in the anterior right lower lobe (series 6/image  59) raises the possibility of a developing pulmonary infarct in this region, suggesting a nonvisualized subsegmental right lower lobe pulmonary embolism. No suspicious pulmonary nodules. No frank interstitial edema. No pneumothorax. Upper Abdomen: Visualized upper abdomen is grossly unremarkable. Musculoskeletal: Visualized osseous structures are within normal limits. Review of the MIP images confirms the above findings. IMPRESSION: Suspected subsegmental pulmonary emboli at least within the left lower lobe. The nonvisualized subsegmental right lower lobe pulmonary artery is also possible. Subpleural ground-glass opacities in the bilateral lower lobes suggest developing pulmonary infarcts. Associated bilateral lower lobe atelectasis with small bilateral pleural effusions, right greater than left. Electronically Signed: By: Charline Bills M.D. On: 09/19/2019 12:04   US Venous Img Lower Bilateral (DVT)  Result Date: 09/20/2019 CLINICAL DATA:  Bilateral lower extremity pain and edema. Recent diagnosis of pulmonary embolism. Evaluate for lower extremity DVT. EXAM: BILATERAL LOWER EXTREMITY VENOUS DOPPLER ULTRASOUND TECHNIQUE: Gray-scale sonography with graded compression, as well as color Doppler and duplex ultrasound were performed to evaluate the lower extremity deep venous systems from the level of the common femoral vein and including the common femoral, femoral, profunda femoral, popliteal and calf veins including the  posterior tibial, peroneal and gastrocnemius veins when visible. The superficial great saphenous vein was also interrogated. Spectral Doppler was utilized to evaluate flow at rest and with distal augmentation maneuvers in the common femoral, femoral and popliteal veins. COMPARISON:  Chest CT-09/19/2019 FINDINGS: RIGHT LOWER EXTREMITY Common Femoral Vein: No evidence of thrombus. Normal compressibility, respiratory phasicity and response to augmentation. Saphenofemoral Junction: No evidence of thrombus. Normal compressibility and flow on color Doppler imaging. Profunda Femoral Vein: No evidence of thrombus. Normal compressibility and flow on color Doppler imaging. Femoral Vein: No evidence of thrombus. Normal compressibility, respiratory phasicity and response to augmentation. Popliteal Vein: No evidence of thrombus. Normal compressibility, respiratory phasicity and response to augmentation. Calf Veins: No evidence of thrombus. Normal compressibility and flow on color Doppler imaging. Superficial Great Saphenous Vein: No evidence of thrombus. Normal compressibility. Venous Reflux:  None. Other Findings:  None. LEFT LOWER EXTREMITY Common Femoral Vein: No evidence of thrombus. Normal compressibility, respiratory phasicity and response to augmentation. Saphenofemoral Junction: No evidence of thrombus. Normal compressibility and flow on color Doppler imaging. Profunda Femoral Vein: No evidence of thrombus. Normal compressibility and flow on color Doppler imaging. Femoral Vein: No evidence of thrombus. Normal compressibility, respiratory phasicity and response to augmentation. Popliteal Vein: No evidence of thrombus. Normal compressibility, respiratory phasicity and response to augmentation. Calf Veins: No evidence of thrombus. Normal compressibility and flow on color Doppler imaging. Superficial Great Saphenous Vein: No evidence of thrombus. Normal compressibility. Venous Reflux:  None. Other Findings:  None.  IMPRESSION: No evidence of DVT within either lower extremity. Electronically Signed   By: Simonne Come M.D.   On: 09/20/2019 11:42   DG Chest Port 1 View  Result Date: 09/22/2019 CLINICAL DATA:  Pneumonia EXAM: PORTABLE CHEST 1 VIEW COMPARISON:  CTA 09/19/2019, radiograph 09/18/2019 FINDINGS: Redemonstration of bilateral effusions which is slightly larger on the right including fluid tracking in the fissure. Adjacent areas of opacity in both lung bases likely reflect some passive atelectatic change. Difficult to distinguish areas of suspected pulmonary infarct in the left lower lobe. Additional underlying consolidative opacity in the right lung base is difficult to exclude. No pneumothorax. Cardiomediastinal contours are similar to comparison radiography accounting for differences in technique. No acute osseous or soft tissue abnormality. Telemetry leads overlie the chest. IMPRESSION: 1. Increasing bilateral pleural effusions now with fluid  tracking along the right fissures. 2. Adjacent areas of opacity in both lung bases likely reflect at least some passive atelectatic change. Difficult to distinguish areas of superimposed airspace disease in either lung base and previously suspected pulmonary infarct in the left lower lobe. Electronically Signed   By: Lovena Le M.D.   On: 09/22/2019 06:52   ECHOCARDIOGRAM COMPLETE  Result Date: 09/21/2019    ECHOCARDIOGRAM REPORT   Patient Name:   Philip Houston Date of Exam: 09/20/2019 Medical Rec #:  [id number]   Height:       72.0 in Accession #:    [id number]  Weight:       205.0 lb Date of Birth:  Sep 05, 1990   BSA:          2.153 m Patient Age:    28 years    BP:           110/70 mmHg Patient Gender: M           HR:           86 bpm. Exam Location:  [facility] Procedure: 2D Echo, Cardiac Doppler and Color Doppler Indications:     Pulmonary embolus  History:         Patient has prior history of Echocardiogram examinations, most                  recent 01/25/2017. Abnormal ECG.  Brugada Syndrome.  Sonographer:     Wilford Sports Rodgers-Jones Referring Phys:  [id number] [place] Diagnosing Phys: Kate Sable MD IMPRESSIONS  1. Left ventricular ejection fraction, by estimation, is 60 to 65%. The left ventricle has normal function. The left ventricle has no regional wall motion abnormalities. Left ventricular diastolic parameters were normal.  2. Right ventricular systolic function is normal. The right ventricular size is moderately enlarged. There is normal pulmonary artery systolic pressure.  3. The mitral valve is normal in structure. No evidence of mitral valve regurgitation. No evidence of mitral stenosis.  4. The aortic valve is normal in structure. Aortic valve regurgitation is not visualized. No aortic stenosis is present.  5. The inferior vena cava is normal in size with greater than 50% respiratory variability, suggesting right atrial pressure of 3 mmHg. FINDINGS  Left Ventricle: Left ventricular ejection fraction, by estimation, is 60 to 65%. The left ventricle has normal function. The left ventricle has no regional wall motion abnormalities. The left ventricular internal cavity size was normal in size. There is  no left ventricular hypertrophy. Left ventricular diastolic parameters were normal. Right Ventricle: The right ventricular size is moderately enlarged. No increase in right ventricular wall thickness. Right ventricular systolic function is normal. There is normal pulmonary artery systolic pressure. The tricuspid regurgitant velocity is 2.16 m/s, and with an assumed right atrial pressure of 3 mmHg, the estimated right ventricular systolic pressure is 58.0 mmHg. Left Atrium: Left atrial size was normal in size. Right Atrium: Right atrial size was normal in size. Pericardium: There is no evidence of pericardial effusion. Mitral Valve: The mitral valve is normal in structure. Normal mobility of the mitral valve leaflets. No evidence of mitral valve regurgitation. No evidence  of mitral valve stenosis. Tricuspid Valve: The tricuspid valve is normal in structure. Tricuspid valve regurgitation is not demonstrated. No evidence of tricuspid stenosis. Aortic Valve: The aortic valve is normal in structure. Aortic valve regurgitation is not visualized. No aortic stenosis is present. Pulmonic Valve: The pulmonic valve was normal in structure. Pulmonic valve regurgitation is not visualized.  No evidence of pulmonic stenosis. Aorta: The aortic root is normal in size and structure. Venous: The inferior vena cava is normal in size with greater than 50% respiratory variability, suggesting right atrial pressure of 3 mmHg. IAS/Shunts: No atrial level shunt detected by color flow Doppler.  LEFT VENTRICLE PLAX 2D LVIDd:         4.49 cm  Diastology LVIDs:         2.75 cm  LV e' lateral:   6.96 cm/s LV PW:         0.82 cm  LV E/e' lateral: 12.4 LV IVS:        0.95 cm  LV e' medial:    9.25 cm/s LVOT diam:     2.00 cm  LV E/e' medial:  9.4 LV SV:         43 LV SV Index:   20 LVOT Area:     3.14 cm  RIGHT VENTRICLE RV Basal diam:  4.50 cm RV S prime:     20.30 cm/s TAPSE (M-mode): 2.8 cm LEFT ATRIUM             Index       RIGHT ATRIUM           Index LA diam:        4.00 cm 1.86 cm/m  RA Area:     18.80 cm LA Vol (A2C):   29.1 ml 13.52 ml/m RA Volume:   62.20 ml  28.89 ml/m LA Vol (A4C):   41.9 ml 19.46 ml/m LA Biplane Vol: 37.6 ml 17.46 ml/m  AORTIC VALVE LVOT Vmax:   88.10 cm/s LVOT Vmean:  62.100 cm/s LVOT VTI:    0.137 m  AORTA Ao Root diam: 3.20 cm MITRAL VALVE               TRICUSPID VALVE MV Area (PHT): 5.13 cm    TR Peak grad:   18.7 mmHg MV Decel Time: 148 msec    TR Vmax:        216.00 cm/s MV E velocity: 86.50 cm/s MV A velocity: 56.10 cm/s  SHUNTS MV E/A ratio:  1.54        Systemic VTI:  0.14 m                            Systemic Diam: 2.00 cm Debbe Odea MD Electronically signed by Debbe Odea MD Signature Date/Time: 09/21/2019/8:11:51 AM    Final       ASSESSMENT & PLAN:   1. Acute pulmonary embolism without acute cor pulmonale, unspecified pulmonary embolism type (HCC)   2. Positive dilute Russell's viper venom time test (DRVVT)    #Acute pulmonary embolism, likely provoked by immobilization/infection Agree with anticoagulation. he is currently on Eliquis 5 mg twice daily and tolerates well.  Continue current regimen. I recommend continue therapeutic anticoagulation for 3-6 months. patient developed first episode of VTE due to transient minor risk factor, extending anticoagulation to 6 months is reasonable given that VTE in the presence of a minor risk factor represents a propensity for thrombosis.   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  Patient is made aware of risk of life-threatening bleeding.  patient wears medical wristband.  Advised patient to avoid contact sports and activity which may pose him to injury. Avoid strenuous activity for minimum of 6 weeks after diagnosis Elevated DRV VT  likely secondary to being on anticoagulation.  Can repeat after anticoagulation be discontinued in the future.  Orders Placed This Encounter  Procedures  . CBC with Differential/Platelet    Standing Status:   Future    Standing Expiration Date:   10/17/2020  . Comprehensive metabolic panel    Standing Status:   Future    Standing Expiration Date:   10/17/2020    All questions were answered. The patient knows to call the clinic with any problems questions or concerns.  cc Salena Saner, MD    Return of visit: 3 months  thank you for this kind referral and the opportunity to participate in the care of this patient. A copy of today's note is routed to referring provider    Rickard Patience, MD, PhD Hematology Oncology [facility] Pager- [phone] 10/18/2019

## 2019-10-18 NOTE — Progress Notes (Signed)
Patient here for initial oncology appointment, expresses no complaints or concerns at this time.   

## 2019-11-21 ENCOUNTER — Ambulatory Visit: Payer: BC Managed Care – PPO | Admitting: Pulmonary Disease

## 2019-11-21 ENCOUNTER — Encounter: Payer: Self-pay | Admitting: Pulmonary Disease

## 2019-11-21 ENCOUNTER — Other Ambulatory Visit: Payer: Self-pay

## 2019-11-21 VITALS — BP 114/80 | HR 64 | Temp 98.1°F | Ht 72.0 in | Wt 211.2 lb

## 2019-11-21 DIAGNOSIS — Z86711 Personal history of pulmonary embolism: Secondary | ICD-10-CM

## 2019-11-21 DIAGNOSIS — I498 Other specified cardiac arrhythmias: Secondary | ICD-10-CM

## 2019-11-21 NOTE — Patient Instructions (Signed)
Continue Eliquis for now.  Follow-up in 2 to 3 months.

## 2019-12-04 ENCOUNTER — Telehealth: Payer: Self-pay | Admitting: Internal Medicine

## 2019-12-04 DIAGNOSIS — I2699 Other pulmonary embolism without acute cor pulmonale: Secondary | ICD-10-CM

## 2019-12-04 MED ORDER — APIXABAN 5 MG PO TABS: 5.0000 mg | ORAL_TABLET | Freq: Two times a day (BID) | ORAL | 1 refills | Status: DC

## 2019-12-04 NOTE — Telephone Encounter (Signed)
Pt needs refill on apixaban (ELIQUIS) 5 MG TABS tablet ASAP. Pt is supposed to be on this for 6 months. There were no refills on bottle. Pt had to get emergency supply.

## 2019-12-25 ENCOUNTER — Other Ambulatory Visit (INDEPENDENT_AMBULATORY_CARE_PROVIDER_SITE_OTHER): Payer: BC Managed Care – PPO

## 2019-12-25 ENCOUNTER — Other Ambulatory Visit: Payer: Self-pay

## 2019-12-25 ENCOUNTER — Other Ambulatory Visit: Payer: BC Managed Care – PPO

## 2019-12-25 DIAGNOSIS — Z13220 Encounter for screening for lipoid disorders: Secondary | ICD-10-CM

## 2019-12-25 DIAGNOSIS — Z1329 Encounter for screening for other suspected endocrine disorder: Secondary | ICD-10-CM

## 2019-12-25 DIAGNOSIS — Z1389 Encounter for screening for other disorder: Secondary | ICD-10-CM

## 2019-12-25 DIAGNOSIS — E559 Vitamin D deficiency, unspecified: Secondary | ICD-10-CM

## 2019-12-25 LAB — LIPID PANEL
Cholesterol: 209 mg/dL — ABNORMAL HIGH (ref 0–200)
HDL: 36.6 mg/dL — ABNORMAL LOW (ref >39.00)
LDL Cholesterol: 144 mg/dL — ABNORMAL HIGH (ref 0–99)
NonHDL: 172.61
Total CHOL/HDL Ratio: 6
Triglycerides: 141 mg/dL (ref 0.0–149.0)
VLDL: 28.2 mg/dL (ref 0.0–40.0)

## 2019-12-25 LAB — TSH: TSH: 1.85 u[IU]/mL (ref 0.35–4.50)

## 2019-12-25 LAB — VITAMIN D 25 HYDROXY (VIT D DEFICIENCY, FRACTURES): VITD: 30.64 ng/mL (ref 30.00–100.00)

## 2020-01-05 ENCOUNTER — Encounter: Payer: Self-pay | Admitting: Internal Medicine

## 2020-01-05 ENCOUNTER — Other Ambulatory Visit: Payer: Self-pay

## 2020-01-05 ENCOUNTER — Ambulatory Visit: Payer: BC Managed Care – PPO | Admitting: Internal Medicine

## 2020-01-05 VITALS — BP 126/80 | HR 63 | Temp 97.8°F | Ht 72.0 in | Wt 212.6 lb

## 2020-01-05 DIAGNOSIS — I498 Other specified cardiac arrhythmias: Secondary | ICD-10-CM

## 2020-01-05 DIAGNOSIS — E785 Hyperlipidemia, unspecified: Secondary | ICD-10-CM | POA: Insufficient documentation

## 2020-01-05 DIAGNOSIS — L259 Unspecified contact dermatitis, unspecified cause: Secondary | ICD-10-CM

## 2020-01-05 DIAGNOSIS — Z23 Encounter for immunization: Secondary | ICD-10-CM

## 2020-01-05 DIAGNOSIS — L309 Dermatitis, unspecified: Secondary | ICD-10-CM | POA: Diagnosis not present

## 2020-01-05 DIAGNOSIS — I2699 Other pulmonary embolism without acute cor pulmonale: Secondary | ICD-10-CM

## 2020-01-05 DIAGNOSIS — Z0000 Encounter for general adult medical examination without abnormal findings: Secondary | ICD-10-CM | POA: Diagnosis not present

## 2020-01-05 MED ORDER — CLOBETASOL PROPIONATE 0.05 % EX CREA: 1.0000 "application " | TOPICAL_CREAM | Freq: Two times a day (BID) | CUTANEOUS | 2 refills | Status: DC

## 2020-01-05 NOTE — Progress Notes (Signed)
Chief Complaint  Patient presents with  . Follow-up  . Immunizations    flu shot   Annual  1. PE doing well on eliquis 5 mg bid f/u Dr. Cathie Hoops 01/19/20 to determine further management doing well O2 normal  2. brugada denies CP/syncope will f/u EP 06/2020  3. HLD will work on diet  4 eczema left ankle using TMC still red and itching at times and eczema hands agreeable to see derm  Review of Systems  Constitutional: Negative for weight loss.  HENT: Negative for hearing loss.   Eyes: Negative for blurred vision.  Respiratory: Negative for shortness of breath.   Cardiovascular: Negative for chest pain.  Gastrointestinal: Negative for abdominal pain.  Musculoskeletal: Negative for falls.  Skin: Positive for itching and rash.  Neurological: Negative for headaches.  Psychiatric/Behavioral: Negative for depression.   Past Medical History:  Diagnosis Date  . Abnormal ECG    Brugada Type 1 pattern  . Brugada syndrome    01/2017 presented after working out with lightheadedness, heart flutter and irregular HB  . Eczema   . Pulmonary embolism [facility])    Past Surgical History:  Procedure Laterality Date  . LACERATION REPAIR     right thumb 29 y.o    Family History  Problem Relation Age of Onset  . Diabetes Father   . Arrhythmia Brother   . Hemachromatosis Other        4 paternal uncles   Social History   Socioeconomic History  . Marital status: Married    Spouse name: Not on file  . Number of children: Not on file  . Years of education: Not on file  . Highest education level: Not on file  Occupational History  . Not on file  Tobacco Use  . Smoking status: Never Smoker  . Smokeless tobacco: Never Used  Vaping Use  . Vaping Use: Never used  Substance and Sexual Activity  . Alcohol use: Yes    Comment: socially  . Drug use: No  . Sexual activity: Yes    Partners: Female  Other Topics Concern  . Not on file  Social History Narrative   Transferred from [place] [employer]  in 07/2018 to [employer]    Married no kids     He is middle of 5 kids       Wife DPR Refael Fulop [phone]   Social Determinants of Health   Financial Resource Strain:   . Difficulty of Paying Living Expenses: Not on file  Food Insecurity:   . Worried About Programme researcher, broadcasting/film/video in the Last Year: Not on file  . Ran Out of Food in the Last Year: Not on file  Transportation Needs:   . Lack of Transportation (Medical): Not on file  . Lack of Transportation (Non-Medical): Not on file  Physical Activity:   . Days of Exercise per Week: Not on file  . Minutes of Exercise per Session: Not on file  Stress:   . Feeling of Stress : Not on file  Social Connections:   . Frequency of Communication with Friends and Family: Not on file  . Frequency of Social Gatherings with Friends and Family: Not on file  . Attends Religious Services: Not on file  . Active Member of Clubs or Organizations: Not on file  . Attends Banker Meetings: Not on file  . Marital Status: Not on file  Intimate Partner Violence:   . Fear of Current or Ex-Partner: Not on  file  . Emotionally Abused: Not on file  . Physically Abused: Not on file  . Sexually Abused: Not on file   Current Meds  Medication Sig  . apixaban (ELIQUIS) 5 MG TABS tablet Take 1 tablet (5 mg total) by mouth 2 (two) times daily.   Allergies  Allergen Reactions  . Sulfa Antibiotics Rash    "when I was a kid" rash    Recent Results (from the past 2160 hour(s))  Vitamin D (25 hydroxy)     Status: None   Collection Time: 12/25/19 11:08 AM  Result Value Ref Range   VITD 30.64 30.00 - 100.00 ng/mL  TSH     Status: None   Collection Time: 12/25/19 11:08 AM  Result Value Ref Range   TSH 1.85 0.35 - 4.50 uIU/mL  Lipid panel     Status: Abnormal   Collection Time: 12/25/19 11:08 AM  Result Value Ref Range   Cholesterol 209 (H) 0 - 200 mg/dL    Comment: ATP III Classification       Desirable:  < 200 mg/dL                Borderline High:  200 - 239 mg/dL          High:  > = 660 mg/dL   Triglycerides 630.1 0 - 149 mg/dL    Comment: Normal:  <601 mg/dLBorderline High:  150 - 199 mg/dL   HDL 09.32 (L) >35.57 mg/dL   VLDL 32.2 0.0 - 02.5 mg/dL   LDL Cholesterol 427 (H) 0 - 99 mg/dL   Total CHOL/HDL Ratio 6     Comment:                Men          Women1/2 Average Risk     3.4          3.3Average Risk          5.0          4.42X Average Risk          9.6          7.13X Average Risk          15.0          11.0                       NonHDL 172.61     Comment: NOTE:  Non-HDL goal should be 30 mg/dL higher than patient's LDL goal (i.e. LDL goal of < 70 mg/dL, would have non-HDL goal of < 100 mg/dL)   Objective  Body mass index is 28.83 kg/m. Wt Readings from Last 3 Encounters:  01/05/20 212 lb 9.6 oz (96.4 kg)  11/21/19 (!) 211 lb 3.2 oz (95.8 kg)  10/18/19 203 lb 11.2 oz (92.4 kg)   Temp Readings from Last 3 Encounters:  01/05/20 97.8 F (36.6 C) (Oral)  11/21/19 98.1 F (36.7 C) (Oral)  10/18/19 97.7 F (36.5 C) (Oral)   BP Readings from Last 3 Encounters:  01/05/20 126/80  11/21/19 114/80  10/18/19 121/78   Pulse Readings from Last 3 Encounters:  01/05/20 63  11/21/19 64  10/18/19 60    Physical Exam Vitals and nursing note reviewed.  Constitutional:      Appearance: Normal appearance. He is well-developed, well-groomed and overweight.  HENT:     Head: Normocephalic and atraumatic.  Eyes:     Conjunctiva/sclera: Conjunctivae normal.     Pupils:  Pupils are equal, round, and reactive to light.  Cardiovascular:     Rate and Rhythm: Normal rate and regular rhythm.     Heart sounds: Normal heart sounds. No murmur heard.   Pulmonary:     Effort: Pulmonary effort is normal.     Breath sounds: Normal breath sounds.  Abdominal:     General: Abdomen is flat. Bowel sounds are normal.     Tenderness: There is no abdominal tenderness.  Musculoskeletal:       Feet:  Skin:    General: Skin is  warm and dry.       Neurological:     General: No focal deficit present.     Mental Status: He is alert and oriented to person, place, and time. Mental status is at baseline.     Gait: Gait normal.  Psychiatric:        Attention and Perception: Attention and perception normal.        Mood and Affect: Mood and affect normal.        Speech: Speech normal.        Behavior: Behavior normal. Behavior is cooperative.        Thought Content: Thought content normal.        Cognition and Memory: Cognition and memory normal.        Judgment: Judgment normal.     Assessment  Plan  Annual physical exam Needs flu shot - Plan: Flu Vaccine QUAD 6+ mos PF IM (Fluarix Quad PF) sch fasting labs [facility] 02/03/19  Flu shot declines  Pfizer 2/2  Tdap per pt had in 2019  Hep B immune per pt  rec healthy diet and exercise    Eczema, unspecified type - Plan: Ambulatory referral to Dermatology, clobetasol cream (TEMOVATE) 0.05 % Hand eczema - Plan: Ambulatory referral to Dermatology, clobetasol cream (TEMOVATE) 0.05 % Contact dermatitis, unspecified contact dermatitis type, unspecified trigger - Plan: Ambulatory referral to Dermatology, clobetasol cream (TEMOVATE) 0.05 % -consider allergy call back if wanted referral  Hyperlipidemia, unspecified hyperlipidemia type  Given info  Brugada  rec f/u Dr. Graciela Husbands 06/2020 Provider: Dr. French Ana McLean-Scocuzza-Internal Medicine

## 2020-01-05 NOTE — Patient Instructions (Addendum)
Results for Philip Houston, Philip Houston (MRN [id number]) as of 01/05/2020 09:20  Ref. Range 12/25/2019 11:08  Cholesterol Latest Ref Range: 0 - 200 mg/dL 264 (H)  HDL Cholesterol Latest Ref Range: >39.00 mg/dL 15.83 (L)  LDL (calc) Latest Ref Range: 0 - 99 mg/dL 094 (H)  NonHDL Unknown 172.61  Triglycerides Latest Ref Range: 0 - 149 mg/dL 076.8  VLDL Latest Ref Range: 0.0 - 40.0 mg/dL 08.8     Dr. Graciela Husbands EP 04/1029 [phone]  [address] [place]  [phone] [address]  High Cholesterol  High cholesterol is a condition in which the blood has high levels of a white, waxy, fat-like substance (cholesterol). The human body needs small amounts of cholesterol. The liver makes all the cholesterol that the body needs. Extra (excess) cholesterol comes from the food that we eat. Cholesterol is carried from the liver by the blood through the blood vessels. If you have high cholesterol, deposits (plaques) may build up on the walls of your blood vessels (arteries). Plaques make the arteries narrower and stiffer. Cholesterol plaques increase your risk for heart attack and stroke. Work with your health care provider to keep your cholesterol levels in a healthy range. What increases the risk? This condition is more likely to develop in people who:  Eat foods that are high in animal fat (saturated fat) or cholesterol.  Are overweight.  Are not getting enough exercise.  Have a family history of high cholesterol. What are the signs or symptoms? There are no symptoms of this condition. How is this diagnosed? This condition may be diagnosed from the results of a blood test.  If you are older than age 53, your health care provider may check your cholesterol every 4-6 years.  You may be checked more often if you already have high cholesterol or other risk factors for heart disease. The blood test for cholesterol measures:  "Bad" cholesterol (LDL cholesterol). This is the main type of cholesterol  that causes heart disease. The desired level for LDL is less than 100.  "Good" cholesterol (HDL cholesterol). This type helps to protect against heart disease by cleaning the arteries and carrying the LDL away. The desired level for HDL is 60 or higher.  Triglycerides. These are fats that the body can store or burn for energy. The desired number for triglycerides is lower than 150.  Total cholesterol. This is a measure of the total amount of cholesterol in your blood, including LDL cholesterol, HDL cholesterol, and triglycerides. A healthy number is less than 200. How is this treated? This condition is treated with diet changes, lifestyle changes, and medicines. Diet changes  This may include eating more whole grains, fruits, vegetables, nuts, and fish.  This may also include cutting back on red meat and foods that have a lot of added sugar. Lifestyle changes  Changes may include getting at least 40 minutes of aerobic exercise 3 times a week. Aerobic exercises include walking, biking, and swimming. Aerobic exercise along with a healthy diet can help you maintain a healthy weight.  Changes may also include quitting smoking. Medicines  Medicines are usually given if diet and lifestyle changes have failed to reduce your cholesterol to healthy levels.  Your health care provider may prescribe a statin medicine. Statin medicines have been shown to reduce cholesterol, which can reduce the risk of heart disease. Follow these instructions at home: Eating and drinking If told by your health care provider:  Eat chicken (without  skin), fish, veal, shellfish, ground [place] breast, and round or loin cuts of red meat.  Do not eat fried foods or fatty meats, such as hot dogs and salami.  Eat plenty of fruits, such as apples.  Eat plenty of vegetables, such as broccoli, potatoes, and carrots.  Eat beans, peas, and lentils.  Eat grains such as barley, rice, couscous, and bulgur wheat.  Eat  pasta without cream sauces.  Use skim or nonfat milk, and eat low-fat or nonfat yogurt and cheeses.  Do not eat or drink whole milk, cream, ice cream, egg yolks, or hard cheeses.  Do not eat stick margarine or tub margarines that contain trans fats (also called partially hydrogenated oils).  Do not eat saturated tropical oils, such as coconut oil and palm oil.  Do not eat cakes, cookies, crackers, or other baked goods that contain trans fats.  General instructions  Exercise as directed by your health care provider. Increase your activity level with activities such as gardening, walking, and taking the stairs.  Take over-the-counter and prescription medicines only as told by your health care provider.  Do not use any products that contain nicotine or tobacco, such as cigarettes and e-cigarettes. If you need help quitting, ask your health care provider.  Keep all follow-up visits as told by your health care provider. This is important. Contact a health care provider if:  You are struggling to maintain a healthy diet or weight.  You need help to start on an exercise program.  You need help to stop smoking. Get help right away if:  You have chest pain.  You have trouble breathing. This information is not intended to replace advice given to you by your health care provider. Make sure you discuss any questions you have with your health care provider. Document Revised: 04/16/2017 Document Reviewed: 10/12/2015 Elsevier Patient Education  2020 [employer].  Cholesterol Content in Foods Cholesterol is a waxy, fat-like substance that helps to carry fat in the blood. The body needs cholesterol in small amounts, but too much cholesterol can cause damage to the arteries and heart. Most people should eat less than 200 milligrams (mg) of cholesterol a day. Foods with cholesterol  Cholesterol is found in animal-based foods, such as meat, seafood, and dairy. Generally, low-fat dairy and  lean meats have less cholesterol than full-fat dairy and fatty meats. The milligrams of cholesterol per serving (mg per serving) of common cholesterol-containing foods are listed below. Meat and other proteins  Egg -- one large whole egg has 186 mg.  Veal shank -- 4 oz has 141 mg.  Lean ground [place] (93% lean) -- 4 oz has 118 mg.  Fat-trimmed lamb loin -- 4 oz has 106 mg.  Lean ground beef (90% lean) -- 4 oz has 100 mg.  Lobster -- 3.5 oz has 90 mg.  Pork loin chops -- 4 oz has 86 mg.  Canned salmon -- 3.5 oz has 83 mg.  Fat-trimmed beef top loin -- 4 oz has 78 mg.  Frankfurter -- 1 frank (3.5 oz) has 77 mg.  Crab -- 3.5 oz has 71 mg.  Roasted chicken without skin, white meat -- 4 oz has 66 mg.  Light bologna -- 2 oz has 45 mg.  Deli-cut [place] -- 2 oz has 31 mg.  Canned tuna -- 3.5 oz has 31 mg.  Tomasa Blase -- 1 oz has 29 mg.  Oysters and mussels (raw) -- 3.5 oz has 25 mg.  Mackerel -- 1 oz has 22  mg.  Trout -- 1 oz has 20 mg.  Pork sausage -- 1 link (1 oz) has 17 mg.  Salmon -- 1 oz has 16 mg.  Tilapia -- 1 oz has 14 mg. Dairy  Soft-serve ice cream --  cup (4 oz) has 103 mg.  Whole-milk yogurt -- 1 cup (8 oz) has 29 mg.  Cheddar cheese -- 1 oz has 28 mg.  American cheese -- 1 oz has 28 mg.  Whole milk -- 1 cup (8 oz) has 23 mg.  2% milk -- 1 cup (8 oz) has 18 mg.  Cream cheese -- 1 tablespoon (Tbsp) has 15 mg.  Cottage cheese --  cup (4 oz) has 14 mg.  Low-fat (1%) milk -- 1 cup (8 oz) has 10 mg.  Sour cream -- 1 Tbsp has 8.5 mg.  Low-fat yogurt -- 1 cup (8 oz) has 8 mg.  Nonfat Greek yogurt -- 1 cup (8 oz) has 7 mg.  Half-and-half cream -- 1 Tbsp has 5 mg. Fats and oils  Cod liver oil -- 1 tablespoon (Tbsp) has 82 mg.  Butter -- 1 Tbsp has 15 mg.  Lard -- 1 Tbsp has 14 mg.  Bacon grease -- 1 Tbsp has 14 mg.  Mayonnaise -- 1 Tbsp has 5-10 mg.  Margarine -- 1 Tbsp has 3-10 mg. Exact amounts of cholesterol in these foods may vary  depending on specific ingredients and brands. Foods without cholesterol Most plant-based foods do not have cholesterol unless you combine them with a food that has cholesterol. Foods without cholesterol include:  Grains and cereals.  Vegetables.  Fruits.  Vegetable oils, such as olive, canola, and sunflower oil.  Legumes, such as peas, beans, and lentils.  Nuts and seeds.  Egg whites. Summary  The body needs cholesterol in small amounts, but too much cholesterol can cause damage to the arteries and heart.  Most people should eat less than 200 milligrams (mg) of cholesterol a day. This information is not intended to replace advice given to you by your health care provider. Make sure you discuss any questions you have with your health care provider. Document Revised: 03/26/2017 Document Reviewed: 12/08/2016 Elsevier Patient Education  2020 [employer].

## 2020-01-19 ENCOUNTER — Other Ambulatory Visit: Payer: Self-pay

## 2020-01-19 ENCOUNTER — Inpatient Hospital Stay: Payer: BC Managed Care – PPO | Attending: Oncology

## 2020-01-19 ENCOUNTER — Inpatient Hospital Stay (HOSPITAL_BASED_OUTPATIENT_CLINIC_OR_DEPARTMENT_OTHER): Payer: BC Managed Care – PPO | Admitting: Oncology

## 2020-01-19 ENCOUNTER — Encounter: Payer: Self-pay | Admitting: Oncology

## 2020-01-19 VITALS — BP 121/74 | HR 56 | Temp 98.0°F | Resp 16 | Wt 209.8 lb

## 2020-01-19 DIAGNOSIS — I2699 Other pulmonary embolism without acute cor pulmonale: Secondary | ICD-10-CM

## 2020-01-19 DIAGNOSIS — R7989 Other specified abnormal findings of blood chemistry: Secondary | ICD-10-CM | POA: Diagnosis not present

## 2020-01-19 DIAGNOSIS — Z8701 Personal history of pneumonia (recurrent): Secondary | ICD-10-CM | POA: Diagnosis not present

## 2020-01-19 DIAGNOSIS — R791 Abnormal coagulation profile: Secondary | ICD-10-CM

## 2020-01-19 DIAGNOSIS — Z86711 Personal history of pulmonary embolism: Secondary | ICD-10-CM | POA: Insufficient documentation

## 2020-01-19 DIAGNOSIS — Z7901 Long term (current) use of anticoagulants: Secondary | ICD-10-CM | POA: Insufficient documentation

## 2020-01-19 LAB — CBC WITH DIFFERENTIAL/PLATELET
Abs Immature Granulocytes: 0.05 10*3/uL (ref 0.00–0.07)
Basophils Absolute: 0.1 10*3/uL (ref 0.0–0.1)
Basophils Relative: 2 %
Eosinophils Absolute: 0.2 10*3/uL (ref 0.0–0.5)
Eosinophils Relative: 3 %
HCT: 43.5 % (ref 39.0–52.0)
Hemoglobin: 15.9 g/dL (ref 13.0–17.0)
Immature Granulocytes: 1 %
Lymphocytes Relative: 28 %
Lymphs Abs: 1.5 10*3/uL (ref 0.7–4.0)
MCH: 29.9 pg (ref 26.0–34.0)
MCHC: 36.6 g/dL — ABNORMAL HIGH (ref 30.0–36.0)
MCV: 81.8 fL (ref 80.0–100.0)
Monocytes Absolute: 0.6 10*3/uL (ref 0.1–1.0)
Monocytes Relative: 11 %
Neutro Abs: 3 10*3/uL (ref 1.7–7.7)
Neutrophils Relative %: 55 %
Platelets: 280 10*3/uL (ref 150–400)
RBC: 5.32 MIL/uL (ref 4.22–5.81)
RDW: 12.4 % (ref 11.5–15.5)
WBC: 5.4 10*3/uL (ref 4.0–10.5)
nRBC: 0 % (ref 0.0–0.2)

## 2020-01-19 LAB — COMPREHENSIVE METABOLIC PANEL
ALT: 56 U/L — ABNORMAL HIGH (ref 0–44)
AST: 33 U/L (ref 15–41)
Albumin: 4.6 g/dL (ref 3.5–5.0)
Alkaline Phosphatase: 53 U/L (ref 38–126)
Anion gap: 8 (ref 5–15)
BUN: 14 mg/dL (ref 6–20)
CO2: 25 mmol/L (ref 22–32)
Calcium: 9.1 mg/dL (ref 8.9–10.3)
Chloride: 106 mmol/L (ref 98–111)
Creatinine, Ser: 1 mg/dL (ref 0.61–1.24)
GFR calc Af Amer: >60 mL/min (ref >60)
GFR calc non Af Amer: >60 mL/min (ref >60)
Glucose, Bld: 98 mg/dL (ref 70–99)
Potassium: 3.8 mmol/L (ref 3.5–5.1)
Sodium: 139 mmol/L (ref 135–145)
Total Bilirubin: 1.4 mg/dL — ABNORMAL HIGH (ref 0.3–1.2)
Total Protein: 7.7 g/dL (ref 6.5–8.1)

## 2020-01-19 LAB — FIBRIN DERIVATIVES D-DIMER (ARMC ONLY): Fibrin derivatives D-dimer (ARMC): 68.19 ng/mL (FEU) (ref 0.00–499.00)

## 2020-01-19 NOTE — Progress Notes (Signed)
Patient denies new problems/concerns today.   °

## 2020-01-19 NOTE — Progress Notes (Signed)
Hematology/Oncology Consult note [facility] Telephone:([phone] [phone]   Patient Care Team: McLean-Scocuzza, Pasty Spillers, MD as PCP - General (Internal Medicine) Rickard Patience, MD as Consulting Physician (Hematology and Oncology)  REFERRING PROVIDER: McLean-Scocuzza, French Ana *  CHIEF COMPLAINTS/REASON FOR VISIT:  Evaluation of acute pulmonary embolism  HISTORY OF PRESENTING ILLNESS:   Philip Houston is a  29 y.o.  male with PMH listed below was seen in consultation at the request of  McLean-Scocuzza, French Ana *  for evaluation of acute pulmonary embolism  Patient presented to emergency room on 09/18/2019 for evaluation of shortness of breath and left shoulder/chest pain. Prior to the onset of acute symptoms, patient had 4-hour round road trip.  When he drove back, he started to notice shoulder/chest pain and his symptoms progressively got worse at night with new onset of shortness of breath.  He presented to the emergency room the next day.  Emergency room he had chills and a temperature of 100.4. Covid 19 was tested and was negative.  Denies any lower extremity calf pain or swelling.  No history of prior DVT. Patient was found to have elected as a level of 1, chest x-ray showed right base infiltration.  Patient was admitted for community acquired pneumonia and was started on Rocephin and doxycycline. CT chest PE protocol showed subsegmental pulmonary emboli at least within the left lower lobe.  The nonvisualized subsegmental right lower lobe pulmonary arteries also possible.  Subpleural groundglass opacities in the bilateral lower lobes suggesting developing pulmonary infarcts.  Associated bilateral lower lobe atelectasis with small bilateral pleural effusions, right greater than left.  Patient was started on IV heparin drip. No lower extremity DVT was discovered. Patient was switched to Eliquis at discharge.  He had hypercoagulable work-up done. -Negative prothrombin  gene mutation and factor V Leiden mutation -Negative Cardiolite pain IgG, IgM, IgA -Normal homocystine level -Normal beta-2 glycoprotein IgG/M/A -Negative lupus anticoagulant.  Prolonged DRV VT. -Normal protein S antigen and activity -Normal protein C antigen and activity -Increased Antithrombin III  INTERVAL HISTORY Philip Houston is a 29 y.o. male who has above history reviewed by me today presents for follow up visit for management of history of pulmonary embolism. Problems and complaints are listed below: Today patient reports tolerating anticoagulation.  No bleeding events. Has any shortness of breath.  Review of Systems  Constitutional: Negative for appetite change, chills, fatigue, fever and unexpected weight change.  HENT:   Negative for hearing loss and voice change.   Eyes: Negative for eye problems and icterus.  Respiratory: Negative for chest tightness, cough and shortness of breath.   Cardiovascular: Negative for chest pain and leg swelling.  Gastrointestinal: Negative for abdominal distention and abdominal pain.  Endocrine: Negative for hot flashes.  Genitourinary: Negative for difficulty urinating, dysuria and frequency.   Musculoskeletal: Negative for arthralgias.  Skin: Negative for itching and rash.  Neurological: Negative for light-headedness and numbness.  Hematological: Negative for adenopathy. Does not bruise/bleed easily.  Psychiatric/Behavioral: Negative for confusion.    MEDICAL HISTORY:  Past Medical History:  Diagnosis Date  . Abnormal ECG    Brugada Type 1 pattern  . Brugada syndrome    01/2017 presented after working out with lightheadedness, heart flutter and irregular HB  . Eczema   . Pulmonary embolism (HCC)     SURGICAL HISTORY: Past Surgical History:  Procedure Laterality Date  . LACERATION REPAIR     right thumb 30 y.o     SOCIAL HISTORY: Social History  Socioeconomic History  . Marital status: Married    Spouse name: Not on file    . Number of children: Not on file  . Years of education: Not on file  . Highest education level: Not on file  Occupational History  . Not on file  Tobacco Use  . Smoking status: Never Smoker  . Smokeless tobacco: Never Used  Vaping Use  . Vaping Use: Never used  Substance and Sexual Activity  . Alcohol use: Yes    Comment: socially  . Drug use: No  . Sexual activity: Yes    Partners: Female  Other Topics Concern  . Not on file  Social History Narrative   Transferred from [place] [employer] de[employer] in 07/2018 to [place] [employer]    Married no kids     He is middle of 5 kids       Wife DPR Philip Houston [phone]   Social Determinants of Health   Financial Resource Strain:   . Difficulty of Paying Living Expenses: Not on file  Food Insecurity:   . Worried About Programme researcher, broadcasting/film/video in the Last Year: Not on file  . Ran Out of Food in the Last Year: Not on file  Transportation Needs:   . Lack of Transportation (Medical): Not on file  . Lack of Transportation (Non-Medical): Not on file  Physical Activity:   . Days of Exercise per Week: Not on file  . Minutes of Exercise per Session: Not on file  Stress:   . Feeling of Stress : Not on file  Social Connections:   . Frequency of Communication with Friends and Family: Not on file  . Frequency of Social Gatherings with Friends and Family: Not on file  . Attends Religious Services: Not on file  . Active Member of Clubs or Organizations: Not on file  . Attends Banker Meetings: Not on file  . Marital Status: Not on file  Intimate Partner Violence:   . Fear of Current or Ex-Partner: Not on file  . Emotionally Abused: Not on file  . Physically Abused: Not on file  . Sexually Abused: Not on file    FAMILY HISTORY: Family History  Problem Relation Age of Onset  . Diabetes Father   . Arrhythmia Brother   . Philip Houston Other        4 paternal uncles    ALLERGIES:  is allergic to sulfa  antibiotics.  MEDICATIONS:  Current Outpatient Medications  Medication Sig Dispense Refill  . apixaban (ELIQUIS) 5 MG TABS tablet Take 1 tablet (5 mg total) by mouth 2 (two) times daily. 180 tablet 1  . clobetasol cream (TEMOVATE) 0.05 % Apply 1 application topically 2 (two) times daily. Prn left ankle and hands 60 g 2  . triamcinolone cream (KENALOG) 0.1 % Apply 1 application topically 2 (two) times daily. exezema     . acetaminophen (TYLENOL) 325 MG tablet Take 2 tablets (650 mg total) by mouth every 6 (six) hours as needed for mild pain or fever. (Patient not taking: Reported on 01/05/2020) 30 tablet 0   No current facility-administered medications for this visit.     PHYSICAL EXAMINATION: ECOG PERFORMANCE STATUS: 0 - Asymptomatic Vitals:   01/19/20 1404  BP: 121/74  Pulse: (!) 56  Resp: 16  Temp: 98 F (36.7 C)   Filed Weights   01/19/20 1404  Weight: 209 lb 12.8 oz (95.2 kg)    Physical Exam Constitutional:      General: He  is not in acute distress. HENT:     Head: Normocephalic and atraumatic.  Eyes:     General: No scleral icterus. Cardiovascular:     Rate and Rhythm: Normal rate and regular rhythm.     Heart sounds: Normal heart sounds.  Pulmonary:     Effort: Pulmonary effort is normal. No respiratory distress.     Breath sounds: No wheezing.  Abdominal:     General: Bowel sounds are normal. There is no distension.     Palpations: Abdomen is soft.  Musculoskeletal:        General: No deformity. Normal range of motion.     Cervical back: Normal range of motion and neck supple.  Skin:    General: Skin is warm and dry.     Findings: No erythema or rash.  Neurological:     Mental Status: He is alert and oriented to person, place, and time. Mental status is at baseline.     Cranial Nerves: No cranial nerve deficit.     Coordination: Coordination normal.  Psychiatric:        Mood and Affect: Mood normal.     LABORATORY DATA:  I have reviewed the data as  listed Lab Results  Component Value Date   WBC 5.4 01/19/2020   HGB 15.9 01/19/2020   HCT 43.5 01/19/2020   MCV 81.8 01/19/2020   PLT 280 01/19/2020   Recent Labs    09/21/19 0443 09/22/19 0447 01/19/20 1349  NA 138 139 139  K 3.9 4.1 3.8  CL 102 105 106  CO2 26 26 25   GLUCOSE 107* 100* 98  BUN 9 14 14   CREATININE 0.87 0.95 1.00  CALCIUM 9.1 9.2 9.1  GFRNONAA >60 >60 >60  GFRAA >60 >60 >60  PROT 7.0 7.1 7.7  ALBUMIN 3.5 3.5 4.6  AST 16 28 33  ALT 23 39 56*  ALKPHOS 67 71 53  BILITOT 1.0 0.7 1.4*   Iron/TIBC/Ferritin/ %Sat No results found for: IRON, TIBC, FERRITIN, IRONPCTSAT    RADIOGRAPHIC STUDIES: I have personally reviewed the radiological images as listed and agreed with the findings in the report. No results found.    ASSESSMENT & PLAN:  1. History of pulmonary embolism    #History of pulmonary embolism, likely provoked by immobilization/infection Patient is doing well on Eliquis 5 mg twice daily. Recommend patient to finish 6 months of anticoagulation. Elevated DRVVT likely secondary to being on anticoagulation.  Can repeat after anticoagulation be discontinued in the future. Patient will have a telemedicine in November and will likely take him off anticoagulation at that point.  Orders Placed This Encounter  Procedures  . CBC with Differential/Platelet    Standing Status:   Future    Standing Expiration Date:   01/18/2021  . Comprehensive metabolic panel    Standing Status:   Future    Standing Expiration Date:   01/18/2021    All questions were answered. The patient knows to call the clinic with any problems questions or concerns.  cc McLean-Scocuzza, 01/20/2021 *    Return of visit:2- 3 months    01/20/2021, MD, PhD Hematology Oncology [facility] at [facility] 01/19/2020

## 2020-01-30 ENCOUNTER — Ambulatory Visit: Payer: BC Managed Care – PPO | Admitting: Internal Medicine

## 2020-02-21 ENCOUNTER — Ambulatory Visit: Payer: BC Managed Care – PPO | Admitting: Dermatology

## 2020-04-08 ENCOUNTER — Other Ambulatory Visit: Payer: Self-pay

## 2020-04-16 ENCOUNTER — Ambulatory Visit: Payer: BC Managed Care – PPO | Admitting: Dermatology

## 2020-09-19 DIAGNOSIS — Z20822 Contact with and (suspected) exposure to covid-19: Secondary | ICD-10-CM | POA: Diagnosis not present

## 2020-12-04 DIAGNOSIS — X32XXXS Exposure to sunlight, sequela: Secondary | ICD-10-CM | POA: Diagnosis not present

## 2020-12-04 DIAGNOSIS — L814 Other melanin hyperpigmentation: Secondary | ICD-10-CM | POA: Diagnosis not present

## 2021-01-01 DIAGNOSIS — L308 Other specified dermatitis: Secondary | ICD-10-CM | POA: Diagnosis not present

## 2021-01-01 DIAGNOSIS — L909 Atrophic disorder of skin, unspecified: Secondary | ICD-10-CM | POA: Diagnosis not present

## 2021-01-08 ENCOUNTER — Encounter: Payer: BC Managed Care – PPO | Admitting: Internal Medicine

## 2021-03-26 ENCOUNTER — Encounter: Payer: BC Managed Care – PPO | Admitting: Internal Medicine

## 2021-03-26 ENCOUNTER — Telehealth: Payer: Self-pay | Admitting: Internal Medicine

## 2021-03-26 NOTE — Telephone Encounter (Signed)
Patient no-showed today's appointment; appointment was for 03/26/21, provider notified for review of record. Letter sent for patient to call in and re-schedule.

## 2021-04-09 ENCOUNTER — Encounter: Payer: Self-pay | Admitting: Internal Medicine

## 2021-04-09 ENCOUNTER — Other Ambulatory Visit: Payer: Self-pay

## 2021-04-09 ENCOUNTER — Ambulatory Visit (INDEPENDENT_AMBULATORY_CARE_PROVIDER_SITE_OTHER): Payer: BC Managed Care – PPO | Admitting: Internal Medicine

## 2021-04-09 VITALS — BP 124/80 | HR 75 | Temp 97.0°F | Ht 71.3 in | Wt 228.2 lb

## 2021-04-09 DIAGNOSIS — Z1389 Encounter for screening for other disorder: Secondary | ICD-10-CM

## 2021-04-09 DIAGNOSIS — E559 Vitamin D deficiency, unspecified: Secondary | ICD-10-CM

## 2021-04-09 DIAGNOSIS — R748 Abnormal levels of other serum enzymes: Secondary | ICD-10-CM

## 2021-04-09 DIAGNOSIS — E785 Hyperlipidemia, unspecified: Secondary | ICD-10-CM | POA: Diagnosis not present

## 2021-04-09 DIAGNOSIS — Z0000 Encounter for general adult medical examination without abnormal findings: Secondary | ICD-10-CM | POA: Diagnosis not present

## 2021-04-09 DIAGNOSIS — R739 Hyperglycemia, unspecified: Secondary | ICD-10-CM

## 2021-04-09 DIAGNOSIS — R791 Abnormal coagulation profile: Secondary | ICD-10-CM

## 2021-04-09 DIAGNOSIS — Z23 Encounter for immunization: Secondary | ICD-10-CM | POA: Diagnosis not present

## 2021-04-09 DIAGNOSIS — I498 Other specified cardiac arrhythmias: Secondary | ICD-10-CM

## 2021-04-09 DIAGNOSIS — E611 Iron deficiency: Secondary | ICD-10-CM

## 2021-04-09 DIAGNOSIS — R9431 Abnormal electrocardiogram [ECG] [EKG]: Secondary | ICD-10-CM

## 2021-04-09 DIAGNOSIS — Z13220 Encounter for screening for lipoid disorders: Secondary | ICD-10-CM

## 2021-04-09 DIAGNOSIS — Z0184 Encounter for antibody response examination: Secondary | ICD-10-CM

## 2021-04-09 DIAGNOSIS — Z8349 Family history of other endocrine, nutritional and metabolic diseases: Secondary | ICD-10-CM

## 2021-04-09 DIAGNOSIS — Z13818 Encounter for screening for other digestive system disorders: Secondary | ICD-10-CM

## 2021-04-09 DIAGNOSIS — Z1329 Encounter for screening for other suspected endocrine disorder: Secondary | ICD-10-CM

## 2021-04-09 NOTE — Progress Notes (Signed)
Chief Complaint  Patient presents with   Annual Exam   Annual 1. H/o b/l DVT txed eliquis 5 mg bid dx b/l dvt 08/2019    Review of Systems  Constitutional:  Negative for weight loss.  HENT:  Negative for hearing loss.   Eyes:  Negative for blurred vision.  Respiratory:  Negative for shortness of breath.   Cardiovascular:  Negative for chest pain.  Gastrointestinal:  Negative for abdominal pain and blood in stool.  Musculoskeletal:  Negative for back pain.  Skin:  Negative for rash.  Neurological:  Negative for headaches.  Psychiatric/Behavioral:  Negative for depression.   Past Medical History:  Diagnosis Date   Abnormal ECG    Brugada Type 1 pattern   Brugada syndrome    01/2017 presented after working out with lightheadedness, heart flutter and irregular HB   Eczema    Pulmonary embolism (HCC)    b/l in 08/2019   Past Surgical History:  Procedure Laterality Date   LACERATION REPAIR     right thumb 30 y.o    Family History  Problem Relation Age of Onset   Diabetes Father    Other Father        prediabetes   Arrhythmia Brother    Hemachromatosis Other        4 paternal uncles   Hemachromatosis Paternal Uncle    Social History   Socioeconomic History   Marital status: Married    Spouse name: Not on file   Number of children: Not on file   Years of education: Not on file   Highest education level: Not on file  Occupational History   Not on file  Tobacco Use   Smoking status: Never   Smokeless tobacco: Never  Vaping Use   Vaping Use: Never used  Substance and Sexual Activity   Alcohol use: Yes    Comment: socially   Drug use: No   Sexual activity: Yes    Partners: Female  Other Topics Concern   Not on file  Social History Narrative   Transferred from [place] [employer] de[employer] in 07/2018 to [place] [employer]    Married no kids     He is middle of 5 kids       Wife DPR Blaise Grieshaber [phone]   Social Determinants of Health   Financial Resource Strain:  Not on file  Food Insecurity: Not on file  Transportation Needs: Not on file  Physical Activity: Not on file  Stress: Not on file  Social Connections: Not on file  Intimate Partner Violence: Not on file   Current Meds  Medication Sig   clobetasol cream (TEMOVATE) 0.05 % Apply 1 application topically 2 (two) times daily. Prn left ankle and hands   Allergies  Allergen Reactions   Sulfa Antibiotics Rash    "when I was a kid" rash    No results found for this or any previous visit (from the past 2160 hour(s)). Objective  Body mass index is 31.56 kg/m. Wt Readings from Last 3 Encounters:  04/09/21 228 lb 3.2 oz (103.5 kg)  01/19/20 209 lb 12.8 oz (95.2 kg)  01/05/20 212 lb 9.6 oz (96.4 kg)   Temp Readings from Last 3 Encounters:  04/09/21 (!) 97 F (36.1 C) (Temporal)  01/19/20 98 F (36.7 C)  01/05/20 97.8 F (36.6 C) (Oral)   BP Readings from Last 3 Encounters:  04/09/21 124/80  01/19/20 121/74  01/05/20 126/80   Pulse Readings from Last 3 Encounters:  04/09/21 75  01/19/20 (!) 56  01/05/20 63    Physical Exam Vitals and nursing note reviewed.  Constitutional:      Appearance: Normal appearance. He is well-developed and well-groomed. He is obese.  HENT:     Head: Normocephalic and atraumatic.  Eyes:     Conjunctiva/sclera: Conjunctivae normal.     Pupils: Pupils are equal, round, and reactive to light.  Cardiovascular:     Rate and Rhythm: Normal rate and regular rhythm.     Heart sounds: Normal heart sounds.  Pulmonary:     Effort: Pulmonary effort is normal. No respiratory distress.     Breath sounds: Normal breath sounds.  Abdominal:     Tenderness: There is no abdominal tenderness.  Skin:    General: Skin is warm and moist.  Neurological:     General: No focal deficit present.     Mental Status: He is alert and oriented to person, place, and time. Mental status is at baseline.     Sensory: Sensation is intact.     Motor: Motor function is intact.      Coordination: Coordination is intact.     Gait: Gait is intact. Gait normal.  Psychiatric:        Attention and Perception: Attention and perception normal.        Mood and Affect: Mood and affect normal.        Speech: Speech normal.        Behavior: Behavior normal. Behavior is cooperative.        Thought Content: Thought content normal.        Cognition and Memory: Cognition and memory normal.        Judgment: Judgment normal.    Assessment  Plan  Annual physical exam - Plan: Comprehensive metabolic panel, Lipid panel, CBC with Differential/Platelet, TSH, Urinalysis, Routine w reflex microscopic, Hemoglobin A1c, Hepatitis C antibody, Vitamin D (25 hydroxy), Hepatitis B surface antibody,quantitative Fasting labs  Needs flu shot - Plan: Flu Vaccine QUAD 6+ mos PF IM (Fluarix Quad PF) See below HM  Abnormal ECG - Plan: Ambulatory referral to Cardiac Electrophysiology Dr. Graciela Husbands Brugada syndrome - Plan: Ambulatory referral to Cardiac Electrophysiology  HM Given today flu shot Flu shot declines  Pfizer 2/2 ? If will get further disc total 4 rec Tdap per pt had in 2019  Hep B immune per pt  rec healthy diet and exercise  Colonoscopy rec 43    Provider: Dr. French Ana McLean-Scocuzza-Internal Medicine

## 2021-04-09 NOTE — Patient Instructions (Signed)
MD No Physician   Primary Contact Information  Phone Fax E-mail Address  [phone] [email or web address] [address]   [address]   [place] [address]     Specialties

## 2021-04-10 ENCOUNTER — Ambulatory Visit: Payer: BC Managed Care – PPO | Admitting: Internal Medicine

## 2021-04-10 ENCOUNTER — Encounter: Payer: Self-pay | Admitting: Internal Medicine

## 2021-04-10 VITALS — BP 114/80 | HR 67 | Ht 72.0 in | Wt 225.0 lb

## 2021-04-10 DIAGNOSIS — I498 Other specified cardiac arrhythmias: Secondary | ICD-10-CM | POA: Diagnosis not present

## 2021-04-10 NOTE — Patient Instructions (Signed)

## 2021-04-10 NOTE — Progress Notes (Signed)
Patient Care Team: McLean-Scocuzza, Pasty Spillers, MD as PCP - General (Internal Medicine) Rickard Patience, MD as Consulting Physician (Hematology and Oncology)   HPI  Philip Houston is a 30 y.o. male Seen in the past for an abnormal Brugada ECG.  He was at that time without symptoms.  No restratification was indicated and antipruritic therapy was recommended. No syncope  Lightheadedness assoc temporally with the use of the new biologic for atopic dermatitis.  A quick literature review fails to identify lightheadedness and as a reported side effect.  Symptoms abated over about 10 minutes, did not abate with sitting and then resolved without recurring  Pulm Embolism attributed to his having been a police car for 4 hours.  Hence, treated for 6 months and then stopped    Records and Results Reviewed   Past Medical History:  Diagnosis Date   Abnormal ECG    Brugada Type 1 pattern   Brugada syndrome    01/2017 presented after working out with lightheadedness, heart flutter and irregular HB   Eczema    Pulmonary embolism (HCC)    b/l in 08/2019    Past Surgical History:  Procedure Laterality Date   LACERATION REPAIR     right thumb 30 y.o     Current Meds  Medication Sig   clobetasol cream (TEMOVATE) 0.05 % Apply 1 application topically 2 (two) times daily. Prn left ankle and hands    Allergies  Allergen Reactions   Sulfa Antibiotics Rash    "when I was a kid" rash       Review of Systems negative except from HPI and PMH  Physical Exam BP 114/80    Pulse 67    Ht 6' (1.829 m)    SpO2 96%    BMI 30.95 kg/m  Well developed and well nourished in no acute distress HENT normal E scleral and icterus clear Neck Supple JVP flat; carotids brisk and full Clear to ausculation Regular rate and rhythm, no murmurs gallops or rub Soft with active bowel sounds No clubbing cyanosis  Edema Alert and oriented, grossly normal motor and sensory function Skin Warm and Dry  ECG sinus  rhythm at 67 Interval 16/12/38 ST segment elevation with downsloping in lead V1 and a Saddleback in lead V2 consistent with Brugada ECG  CrCl cannot be calculated (Patient's most recent lab result is older than the maximum 21 days allowed.).   Assessment and  Plan Brugada ECG   Palpitations  Pulmonary embolism provoked versus unprovoked  Lightheadedness associate with a biologic for asthma    The patient has a Brugada ECG.  In the absence of symptoms, no risk stratification or other therapies are indicated.  He is reminded to be aggressive about antipyretic therapy  Literature search on his biologic does not turn up lightheadedness; the temporal relationship however in the lack of abating of his symptoms with sitting would suggest that it was not volume/orthostatic related.  He is not taking a biologic any further.  This would not be explained by his Brugada ECG  He is advised if he has syncope to let us know is any of the above  I am concerned about his pulmonary embolism and whether it should be considered provoked or unprovoked given the relatively short history of being in a car.  I reached out to his PCP to consider further input from coagulation specialist as to how best to think about this as relates to long-term anticoagulation    Current  medicines are reviewed at length with the patient today .  The patient does not  have concerns regarding medicines.

## 2021-04-11 ENCOUNTER — Telehealth: Payer: Self-pay | Admitting: Internal Medicine

## 2021-04-11 NOTE — Telephone Encounter (Signed)
Per EP Dr. Graciela Husbands  Dr. Cathie Hoops (heme/onc)-can your office reach out to this patient ?   Question for you about this gentleman.  I wonder whether his PE should be reviewed again as to whether provoked or unprovoked.  I am not sure that a few hours in a police car for young guy necessarily is justification for a "provoked PE "I thought may be a consultation with the team at the coagulation clinic at [facility] might be helpful.  Thoughts, I told him that I would reach out to you and that you would reach back to him.  (I.e. I am passing the buck)

## 2021-05-11 IMAGING — US US EXTREM LOW VENOUS
1 series · 13 of 24 positions shown · non-contrast
Comparison: Chest CT-09/19/2019

CLINICAL DATA: Bilateral lower extremity pain and edema. Recent
diagnosis of pulmonary embolism. Evaluate for lower extremity DVT.



[Series 1: us venous img lower bilat (dvt) · portal-venous · 61 acquisitions, 13 frames shown]
[im 1/61]
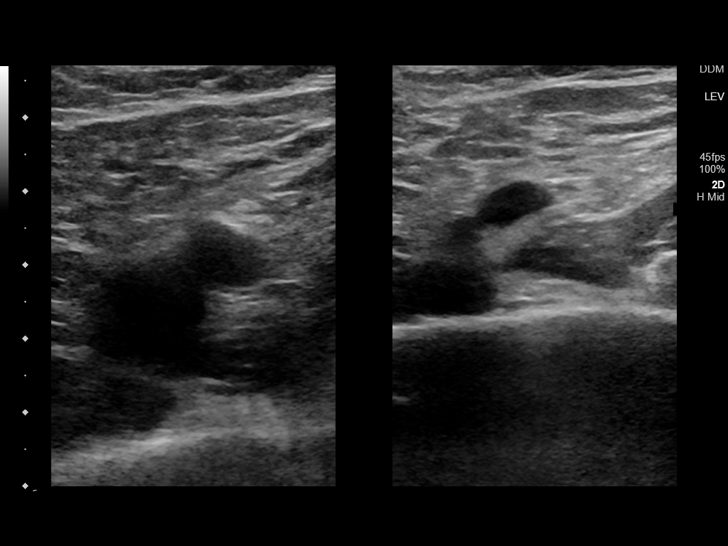
[im 6/61]
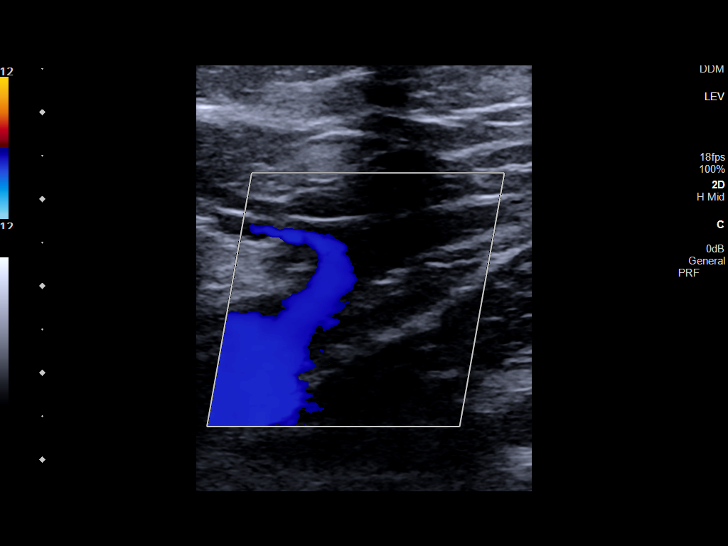
[im 11/61]
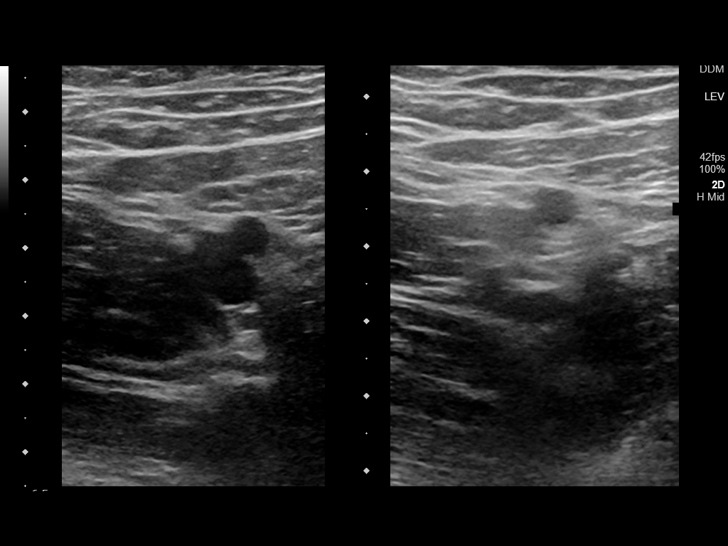
[im 16/61]
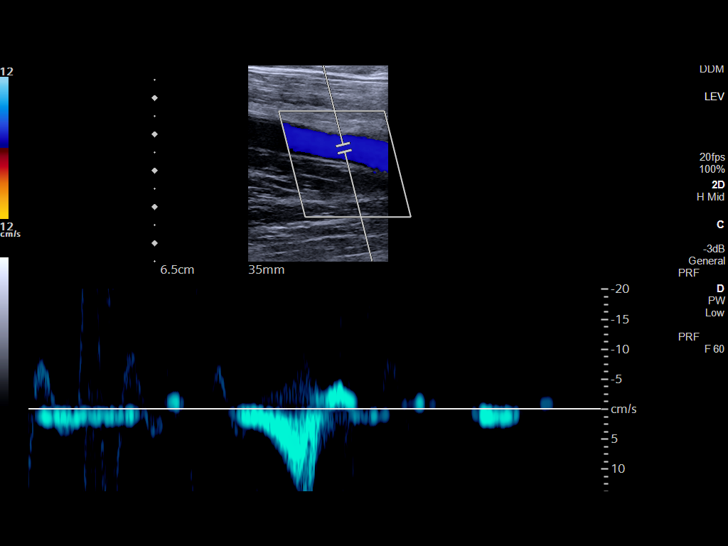
[im 21/61]
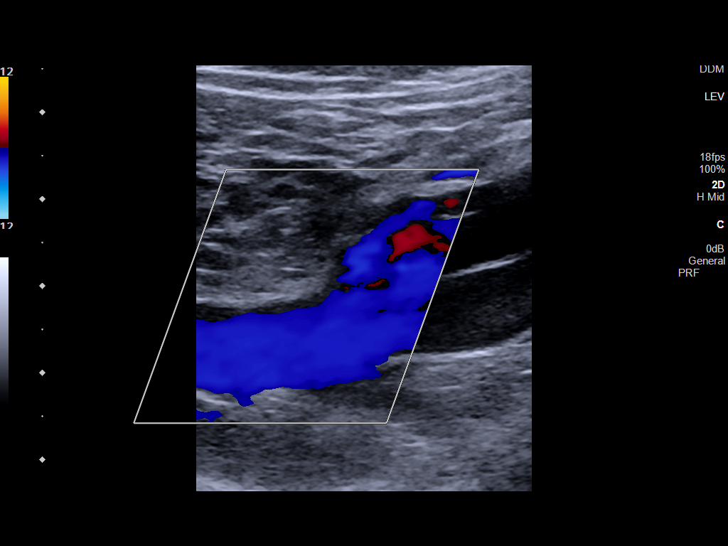
[im 27/61]
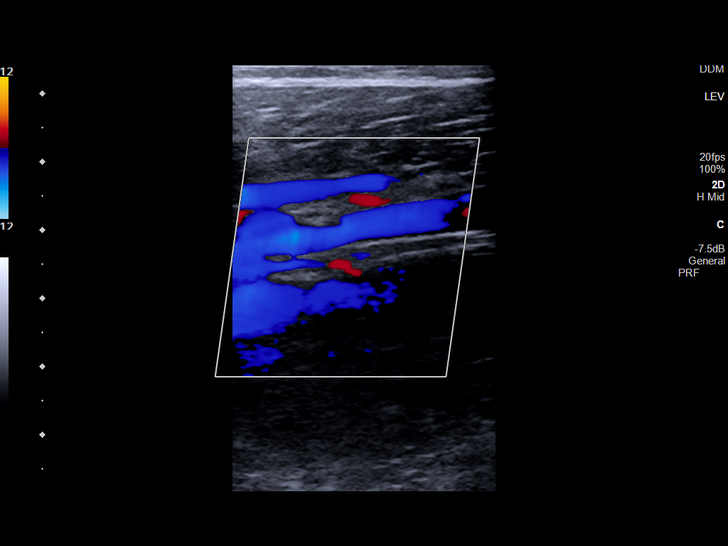
[im 34/61]
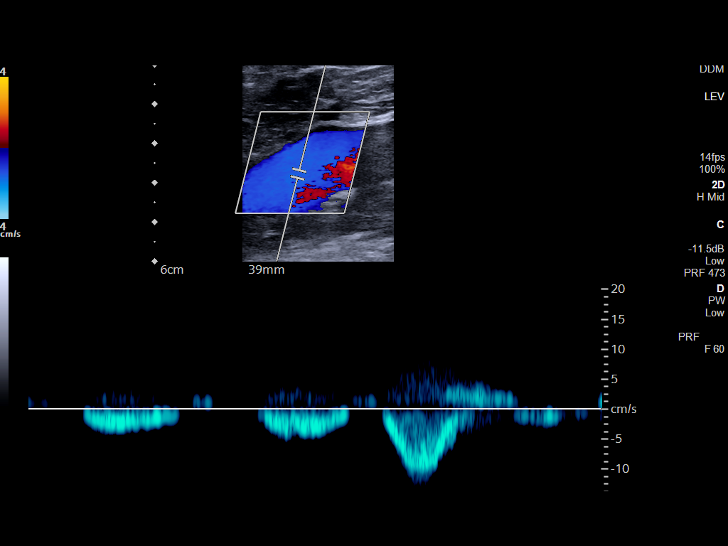
[im 37/61]
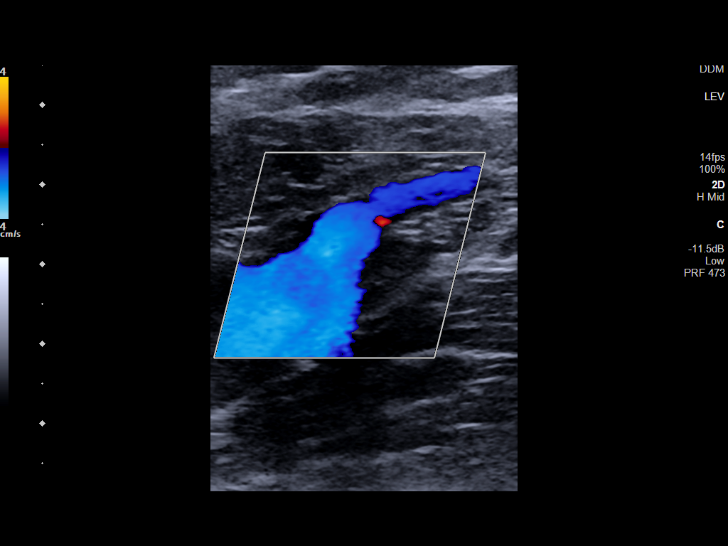
[im 42/61]
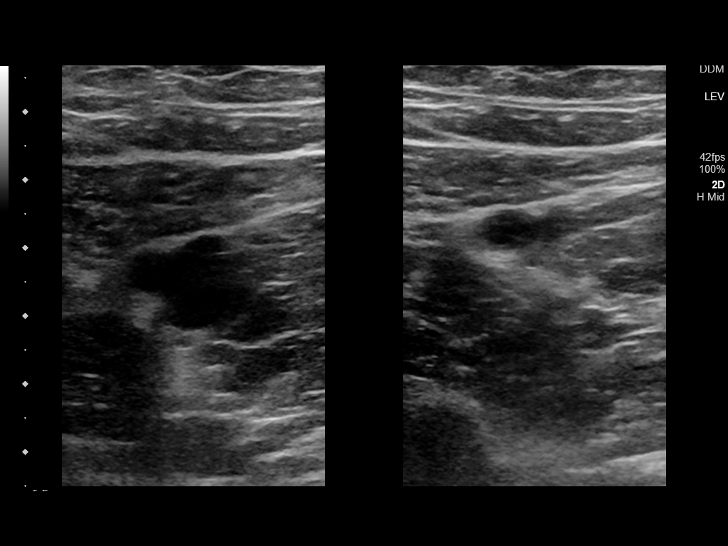
[im 47/61]
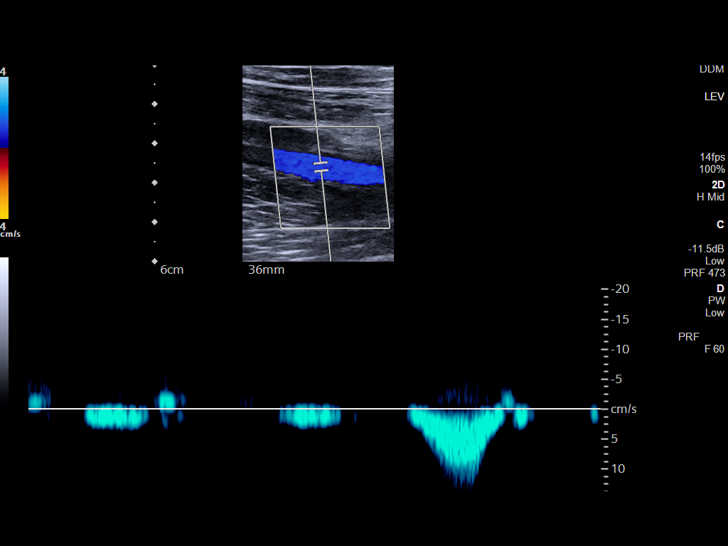
[im 53/61]
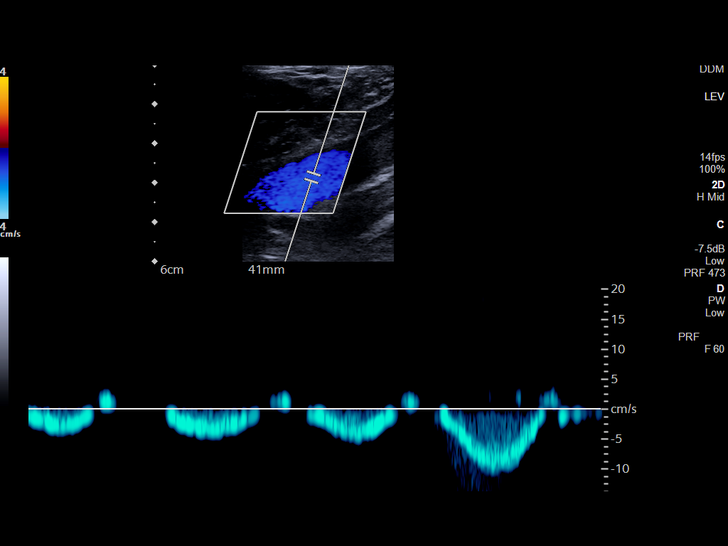
[im 58/61]
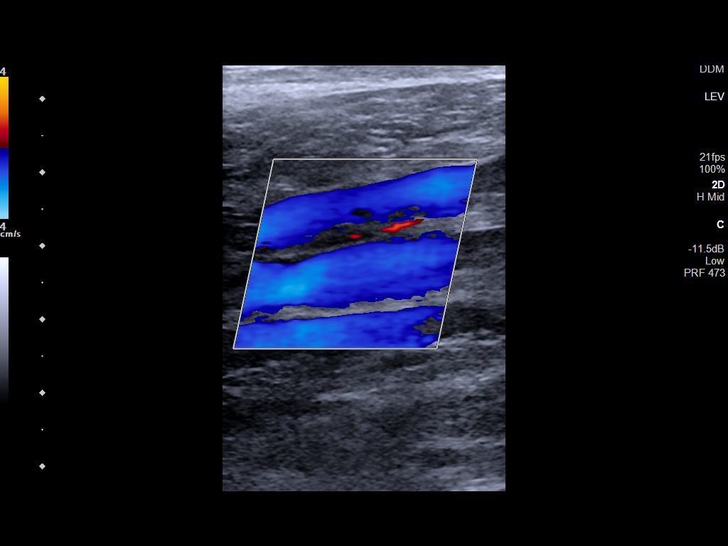
[im 61/61]
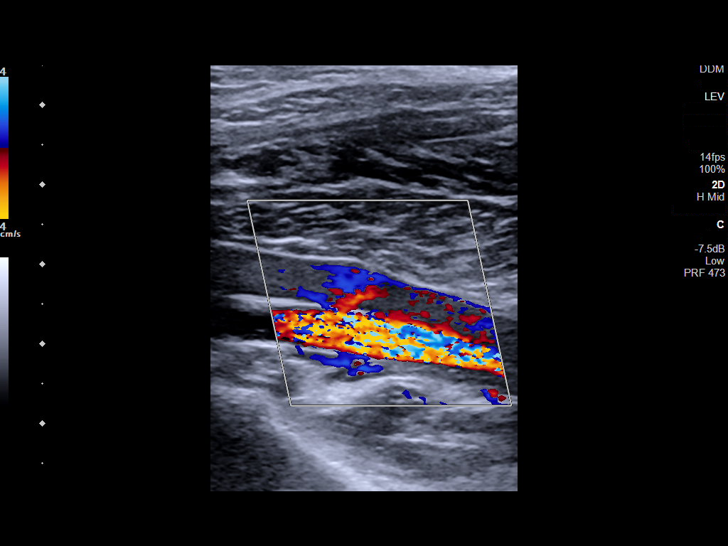

[13 of 24 positions shown; findings below may reference images not displayed]

FINDINGS: RIGHT LOWER EXTREMITY

Common Femoral Vein: No evidence of thrombus. Normal
compressibility, respiratory phasicity and response to augmentation.

Saphenofemoral Junction: No evidence of thrombus. Normal
compressibility and flow on color Doppler imaging.

Profunda Femoral Vein: No evidence of thrombus. Normal
compressibility and flow on color Doppler imaging.

Femoral Vein: No evidence of thrombus. Normal compressibility,
respiratory phasicity and response to augmentation.

Popliteal Vein: No evidence of thrombus. Normal compressibility,
respiratory phasicity and response to augmentation.

Calf Veins: No evidence of thrombus. Normal compressibility and flow
on color Doppler imaging.

Superficial Great Saphenous Vein: No evidence of thrombus. Normal
compressibility.

Venous Reflux:  None.

Other Findings:  None.

LEFT LOWER EXTREMITY

Common Femoral Vein: No evidence of thrombus. Normal
compressibility, respiratory phasicity and response to augmentation.

Saphenofemoral Junction: No evidence of thrombus. Normal
compressibility and flow on color Doppler imaging.

Profunda Femoral Vein: No evidence of thrombus. Normal
compressibility and flow on color Doppler imaging.

Femoral Vein: No evidence of thrombus. Normal compressibility,
respiratory phasicity and response to augmentation.

Popliteal Vein: No evidence of thrombus. Normal compressibility,
respiratory phasicity and response to augmentation.

Calf Veins: No evidence of thrombus. Normal compressibility and flow
on color Doppler imaging.

Superficial Great Saphenous Vein: No evidence of thrombus. Normal
compressibility.

Venous Reflux:  None.

Other Findings:  None.
IMPRESSION: No evidence of DVT within either lower extremity.

## 2021-05-21 ENCOUNTER — Other Ambulatory Visit: Payer: BC Managed Care – PPO

## 2021-06-08 DIAGNOSIS — R002 Palpitations: Secondary | ICD-10-CM | POA: Diagnosis not present

## 2021-06-10 ENCOUNTER — Encounter: Payer: Self-pay | Admitting: Radiology

## 2021-06-10 ENCOUNTER — Emergency Department: Payer: BC Managed Care – PPO

## 2021-06-10 ENCOUNTER — Emergency Department
Admission: EM | Admit: 2021-06-10 | Discharge: 2021-06-10 | Disposition: A | Discharge status: Home / Self Care | Payer: BC Managed Care – PPO | Attending: Emergency Medicine | Admitting: Emergency Medicine

## 2021-06-10 DIAGNOSIS — R002 Palpitations: Secondary | ICD-10-CM

## 2021-06-10 DIAGNOSIS — I498 Other specified cardiac arrhythmias: Secondary | ICD-10-CM | POA: Diagnosis not present

## 2021-06-10 DIAGNOSIS — R202 Paresthesia of skin: Secondary | ICD-10-CM | POA: Insufficient documentation

## 2021-06-10 DIAGNOSIS — M79604 Pain in right leg: Secondary | ICD-10-CM | POA: Diagnosis not present

## 2021-06-10 DIAGNOSIS — J189 Pneumonia, unspecified organism: Secondary | ICD-10-CM | POA: Diagnosis not present

## 2021-06-10 DIAGNOSIS — M25561 Pain in right knee: Secondary | ICD-10-CM | POA: Diagnosis not present

## 2021-06-10 DIAGNOSIS — R0789 Other chest pain: Secondary | ICD-10-CM | POA: Insufficient documentation

## 2021-06-10 DIAGNOSIS — Z7901 Long term (current) use of anticoagulants: Secondary | ICD-10-CM | POA: Diagnosis not present

## 2021-06-10 DIAGNOSIS — R0602 Shortness of breath: Secondary | ICD-10-CM | POA: Diagnosis not present

## 2021-06-10 LAB — CBC
HCT: 45.2 % (ref 39.0–52.0)
Hemoglobin: 15.7 g/dL (ref 13.0–17.0)
MCH: 29.7 pg (ref 26.0–34.0)
MCHC: 34.7 g/dL (ref 30.0–36.0)
MCV: 85.4 fL (ref 80.0–100.0)
Platelets: 284 10*3/uL (ref 150–400)
RBC: 5.29 MIL/uL (ref 4.22–5.81)
RDW: 12.2 % (ref 11.5–15.5)
WBC: 6.5 10*3/uL (ref 4.0–10.5)
nRBC: 0 % (ref 0.0–0.2)

## 2021-06-10 LAB — T4, FREE: Free T4: 0.96 ng/dL (ref 0.61–1.12)

## 2021-06-10 LAB — BASIC METABOLIC PANEL
Anion gap: 6 (ref 5–15)
BUN: 17 mg/dL (ref 6–20)
CO2: 25 mmol/L (ref 22–32)
Calcium: 9.5 mg/dL (ref 8.9–10.3)
Chloride: 109 mmol/L (ref 98–111)
Creatinine, Ser: 0.94 mg/dL (ref 0.61–1.24)
GFR, Estimated: >60 mL/min (ref >60)
Glucose, Bld: 93 mg/dL (ref 70–99)
Potassium: 3.7 mmol/L (ref 3.5–5.1)
Sodium: 140 mmol/L (ref 135–145)

## 2021-06-10 LAB — CK: Total CK: 214 U/L (ref 49–397)

## 2021-06-10 LAB — TROPONIN I (HIGH SENSITIVITY)
Troponin I (High Sensitivity): 4 ng/L (ref <18)
Troponin I (High Sensitivity): 4 ng/L (ref <18)

## 2021-06-10 LAB — PROTIME-INR
INR: 1.1 (ref 0.8–1.2)
Prothrombin Time: 13.8 seconds (ref 11.4–15.2)

## 2021-06-10 LAB — TSH: TSH: 1.665 u[IU]/mL (ref 0.350–4.500)

## 2021-06-10 MED ORDER — IOHEXOL 350 MG/ML SOLN
100.0000 mL | Freq: Once | INTRAVENOUS | Status: AC | PRN
  Administered 2021-06-10: 75 mL via INTRAVENOUS

## 2021-06-10 MED ORDER — SODIUM CHLORIDE 0.9 % IV BOLUS
1000.0000 mL | Freq: Once | INTRAVENOUS | Status: AC
  Administered 2021-06-10: 1000 mL via INTRAVENOUS

## 2021-06-10 NOTE — ED Provider Notes (Signed)
[facility] Provider Note    Event Date/Time   First MD Initiated Contact with Patient 06/10/21 [phone]     (approximate)   History   Palpitations and Tingling   HPI  Philip Houston is a 31 y.o. male who presents to the ED from home with a chief complaint of pain behind his right knee and palpitations.  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  Reports a 3-day history of sharp pain behind his right knee only when in a sitting position in his patrol car relieved by standing and walking.  Also having episodes of palpitations sometimes associated with shortness of breath.  Denies fever, cough, chest pain, abdominal pain, nausea, vomiting or dizziness.  Denies recent trauma or exertion.     Past Medical History   Past Medical History:  Diagnosis Date   Abnormal ECG    Brugada Type 1 pattern   Brugada syndrome    01/2017 presented after working out with lightheadedness, heart flutter and irregular HB   Eczema    Pulmonary embolism ([place])    b/l in 08/2019     Active Problem List   Patient Active Problem List   Diagnosis Date Noted   FH: hemochromatosis 04/09/2021   Hyperlipidemia 01/05/2020   Positive dilute Russell's viper venom time test (DRVVT) 09/27/2019   Annual physical exam 01/26/2019   Eczema 01/26/2019   Brugada syndrome    Abnormal ECG      Past Surgical History   Past Surgical History:  Procedure Laterality Date   LACERATION REPAIR     right thumb 31 y.o      Home Medications   Prior to Admission medications   Medication Sig Start Date End Date Taking? Authorizing Provider  clobetasol cream (TEMOVATE) [id number] % Apply 1 application topically 2 (two) times daily. Prn left ankle and hands 01/05/20   McLean-Scocuzza, Nino Glow, MD     Allergies  Sulfa antibiotics   Family  History   Family History  Problem Relation Age of Onset   Diabetes Father    Other Father        prediabetes   Arrhythmia Brother    Hemachromatosis Other        4 paternal uncles   Hemachromatosis Paternal Uncle      Physical Exam  Triage Vital Signs: ED Triage Vitals  Enc Vitals Group     BP 06/10/21 0417 (!) 130/114     Pulse Rate 06/10/21 0417 70     Resp 06/10/21 0417 18     Temp 06/10/21 0417 98.1 F (36.7 C)     Temp src --      SpO2 06/10/21 0417 96 %     Weight 06/10/21 0418 227 lb 15.3 oz (103.4 kg)     Height --      Head Circumference --      Peak Flow --      Pain Score 06/10/21 0418 2     Pain Loc --      Pain Edu? --      Excl. in [place]? --     Updated Vital Signs: BP 122/78    Pulse 65    Temp 98.1 F (36.7 C)    Resp 18    Wt 103.4 kg    SpO2 97%  BMI 30.92 kg/m    General: Awake, no distress.  CV:  RRR.  Good peripheral perfusion.  Resp:  Normal effort.  CTA B. Abd:  Nontender.  No distention.  Other:  Right calf not swollen and nontender to palpation.  Palpable distal pulses.   ED Results / Procedures / Treatments  Labs (all labs ordered are listed, but only abnormal results are displayed) Labs Reviewed  BASIC METABOLIC PANEL  CBC  PROTIME-INR  TSH  T4, FREE  CK  TROPONIN I (HIGH SENSITIVITY)  TROPONIN I (HIGH SENSITIVITY)     EKG  ED ECG REPORT I, Lamel Mccarley J, the attending physician, personally viewed and interpreted this ECG.   Date: 06/10/2021  EKG Time: 0417  Rate: 66  Rhythm: normal sinus rhythm  Axis: LAD  Intervals: Brugada pattern, type I  ST&T Change: Nonspecific Unchanged from 04/10/2021   RADIOLOGY I have personally reviewed patient's chest x-ray, CTA chest, DVT ultrasound as well as the radiology interpretation:  Chest x-ray: Negative  CTA chest: No PE  DVT ultrasound: No DVT  Official radiology report(s): CT Angio Chest PE W/Cm &/Or Wo Cm  Result Date: 06/10/2021 CLINICAL DATA:  Evaluate for  acute pulmonary embolus. Palpitations. History of blood clots and pulmonary embolus. EXAM: CT ANGIOGRAPHY CHEST WITH CONTRAST TECHNIQUE: Multidetector CT imaging of the chest was performed using the standard protocol during bolus administration of intravenous contrast. Multiplanar CT image reconstructions and MIPs were obtained to evaluate the vascular anatomy. RADIATION DOSE REDUCTION: This exam was performed according to the departmental dose-optimization program which includes automated exposure control, adjustment of the mA and/or kV according to patient size and/or use of iterative reconstruction technique. CONTRAST:  83mL OMNIPAQUE IOHEXOL 350 MG/ML SOLN COMPARISON:  09/19/2019 FINDINGS: Cardiovascular: Satisfactory opacification of the pulmonary arteries to the segmental level. No evidence of pulmonary embolism. Normal heart size. No pericardial effusion. Mediastinum/Nodes: No enlarged mediastinal, hilar, or axillary lymph nodes. Thyroid gland, trachea, and esophagus demonstrate no significant findings. Lungs/Pleura: No pleural effusion, airspace consolidation or atelectasis. No pneumothorax identified. Upper Abdomen: No acute findings within the imaged portions of the upper abdomen. Musculoskeletal: No chest wall abnormality. No acute or significant osseous findings. Mild degenerative disc disease within the midthoracic spine. Review of the MIP images confirms the above findings. IMPRESSION: 1. No acute cardiopulmonary abnormality. 2. No evidence for acute pulmonary embolus. Electronically Signed   By: Kerby Moors M.D.   On: 06/10/2021 06:03   US Venous Img Lower Unilateral Right (DVT)  Result Date: 06/10/2021 CLINICAL DATA:  Right leg pain.  Rule out DVT. EXAM: RIGHT LOWER EXTREMITY VENOUS DOPPLER ULTRASOUND TECHNIQUE: Gray-scale sonography with compression, as well as color and duplex ultrasound, were performed to evaluate the deep venous system(s) from the level of the common femoral vein through  the popliteal and proximal calf veins. COMPARISON:  None. FINDINGS: VENOUS Normal compressibility of the common femoral, superficial femoral, and popliteal veins, as well as the visualized calf veins. Visualized portions of profunda femoral vein and great saphenous vein unremarkable. No filling defects to suggest DVT on grayscale or color Doppler imaging. Doppler waveforms show normal direction of venous flow, normal respiratory plasticity and response to augmentation. Limited views of the contralateral common femoral vein are unremarkable. OTHER None. Limitations: none IMPRESSION: Negative. Electronically Signed   By: Kerby Moors M.D.   On: 06/10/2021 06:45   DG Chest Portable 1 View  Result Date: 06/10/2021 CLINICAL DATA:  31 year old male shortness of breath. Palpitations yesterday while driving. EXAM: PORTABLE  CHEST 1 VIEW COMPARISON:  Portable chest 09/22/2019 and earlier. FINDINGS: Portable AP upright view at 0445 hours. Right lower lobe pneumonia seen in 2021 appears resolved. Normalized lung volumes and lung base ventilation since that time. Normal cardiac size and mediastinal contours. Visualized tracheal air column is within normal limits. No pneumothorax or pleural effusion. No osseous abnormality identified. IMPRESSION: Negative portable chest. Electronically Signed   By: Genevie Ann M.D.   On: 06/10/2021 05:19     PROCEDURES:  Critical Care performed: No  .1-3 Lead EKG Interpretation Performed by: Paulette Blanch, MD Authorized by: Paulette Blanch, MD     Interpretation: normal     ECG rate:  70   ECG rate assessment: normal     Rhythm: sinus rhythm     Ectopy: none     Conduction: normal   Comments:     Patient on cardiac monitor to evaluate for arrhythmias   MEDICATIONS ORDERED IN ED: Medications  sodium chloride 0.9 % bolus 1,000 mL (0 mLs Intravenous Stopped 06/10/21 0558)  iohexol (OMNIPAQUE) 350 MG/ML injection 100 mL (75 mLs Intravenous Contrast Given 06/10/21 0525)      IMPRESSION / MDM / ASSESSMENT AND PLAN / ED COURSE  I reviewed the triage vital signs and the nursing notes.                             31 year old male presenting with pain behind his right knee and palpitations. Differential diagnosis includes, but is not limited to, ACS, aortic dissection, pulmonary embolism, cardiac tamponade, pneumothorax, pneumonia, pericarditis, myocarditis, GI-related causes including esophagitis/gastritis, and musculoskeletal chest wall pain.   I have personally reviewed patient's records and see a cardiology office visit for follow-up Brugada syndrome on 04/10/2021.  The patient is on the cardiac monitor to evaluate for evidence of arrhythmia and/or significant heart rate changes.  We will obtain cardiac panel, thyroid function, DVT ultrasound, CTA chest to evaluate for PE.  Will assess.  Clinical Course as of 06/10/21 0652  Tue Jun 10, 2021  0614 Laboratory results unremarkable: WBC 6.5, creatinine 0.94, initial troponin unremarkable, thyroid panel, CK and coags all within normal limits.  Chest x-ray demonstrates no acute cardiopulmonary process.  CTA chest shows no PE.  At this time we are pending repeat troponin and DVT ultrasound.  Updated patient and his family member on the above results [JS]  [phone] DVT ultrasound is negative.  Repeat troponin pending.  Patient will follow-up with his PCP for repeat ultrasound in 3 days time.  He will follow-up with his cardiologist closely as well.  Anticipate discharge home pending repeat troponin result.  Strict return precautions given.  Patient and family member verbalized understanding agree with plan of care. [JS]    Clinical Course User Index [JS] Paulette Blanch, MD     FINAL CLINICAL IMPRESSION(S) / ED DIAGNOSES   Final diagnoses:  Acute pain of right knee  Palpitations  Brugada syndrome     Rx / DC Orders   ED Discharge Orders     None        Note:  This document was prepared using Dragon voice  recognition software and may include unintentional dictation errors.   Paulette Blanch, MD 06/10/21 [phone]

## 2021-06-10 NOTE — ED Provider Notes (Signed)
Repeat trop negative  D/w pt comfortable with dc home as per off going doctor.      Concha Se, MD 06/10/21 [phone]

## 2021-06-10 NOTE — ED Notes (Signed)
Pt resting comfortably at this time. NAD noted at this time. Pt denies any needs at this time. Call bell in reach. VSS.

## 2021-06-10 NOTE — Discharge Instructions (Addendum)
Return to the ER for worsening symptoms, persistent vomiting, difficulty breathing or other concerns. °

## 2021-06-10 NOTE — ED Triage Notes (Signed)
Palpitations yesterday while driving. Denies any chest pain or shortness of breath at this time. Pt is having tingling in the bend of his knee that has been off and on for the past two days.  Pt has Hx of blood clots and a PE in 2021 and was on Eliquis for 6 months but has since stopped taking any blood thinners.

## 2021-06-10 NOTE — ED Notes (Signed)
D/C and reasons to return to ED discussed with pt, pt verbalized understanding. NAD noted.Pt ambulatory on D/C.  

## 2021-06-18 ENCOUNTER — Encounter: Payer: Self-pay | Admitting: Internal Medicine

## 2021-06-18 ENCOUNTER — Other Ambulatory Visit: Payer: Self-pay

## 2021-06-18 ENCOUNTER — Ambulatory Visit: Payer: BC Managed Care – PPO

## 2021-06-18 ENCOUNTER — Ambulatory Visit: Payer: BC Managed Care – PPO | Admitting: Internal Medicine

## 2021-06-18 VITALS — BP 122/78 | HR 65 | Temp 98.0°F | Ht 72.0 in | Wt 227.2 lb

## 2021-06-18 DIAGNOSIS — M25561 Pain in right knee: Secondary | ICD-10-CM | POA: Diagnosis not present

## 2021-06-18 DIAGNOSIS — Z86718 Personal history of other venous thrombosis and embolism: Secondary | ICD-10-CM

## 2021-06-18 DIAGNOSIS — K760 Fatty (change of) liver, not elsewhere classified: Secondary | ICD-10-CM

## 2021-06-18 DIAGNOSIS — R002 Palpitations: Secondary | ICD-10-CM | POA: Diagnosis not present

## 2021-06-18 DIAGNOSIS — R791 Abnormal coagulation profile: Secondary | ICD-10-CM | POA: Diagnosis not present

## 2021-06-18 DIAGNOSIS — I498 Other specified cardiac arrhythmias: Secondary | ICD-10-CM

## 2021-06-18 DIAGNOSIS — Z86711 Personal history of pulmonary embolism: Secondary | ICD-10-CM | POA: Diagnosis not present

## 2021-06-18 DIAGNOSIS — N2889 Other specified disorders of kidney and ureter: Secondary | ICD-10-CM

## 2021-06-18 DIAGNOSIS — R9431 Abnormal electrocardiogram [ECG] [EKG]: Secondary | ICD-10-CM

## 2021-06-18 NOTE — Progress Notes (Addendum)
Chief Complaint  Patient presents with   Follow-up    ED    Knee Pain    Sharp pain in the back of the right knee. Ultrasound of the lower legs recommended due to history of blood clots in the leg with similar knee pain previous to discovery.    ED f/u 06/10/21 [facility] palpitations and right knee pain h/o DVT but US dvt right leg neg and ct chest neg  Spoke with Dr. Cathie HoopsYu who rec repeat DRVTT test but thought 2/2 noacs and maybe he had provoked event 08/2019 when had pneumonia but pt states he was active and hiked before DVT/PE in 2021  Will repeat testing and see if abnormal and if abnormal consider anticoagulation clinic [facility] per EP Dr. Graciela HusbandsKlein or f/u Dr. Cathie HoopsYu  H/o palpitations/ekg 06/10/21 with brugada pt needs to f/u with EP Dr. Graciela HusbandsKlein and rec zio holter  HR normally in 60s-70s but in the ED it was 95    Right posterior knee pain now resolved he was worried about blood clot and us dvt 06/10/21 negative pain resolved for now declines xray for now  Review of Systems  Constitutional:  Negative for weight loss.  HENT:  Negative for hearing loss.   Eyes:  Negative for blurred vision.  Respiratory:  Negative for shortness of breath.   Cardiovascular:  Positive for palpitations. Negative for chest pain.  Gastrointestinal:  Negative for abdominal pain and blood in stool.  Musculoskeletal:  Negative for back pain and joint pain.  Skin:  Negative for rash.  Neurological:  Negative for headaches.  Psychiatric/Behavioral:  Negative for depression.   Past Medical History:  Diagnosis Date   Abnormal ECG    Brugada Type 1 pattern   Brugada syndrome    01/2017 presented after working out with lightheadedness, heart flutter and irregular HB   Eczema    Pulmonary embolism ([facility])    b/l in 08/2019   Past Surgical History:  Procedure Laterality Date   LACERATION REPAIR     right thumb 31 y.o    Family History  Problem Relation Age of Onset   Diabetes Father    Other Father        prediabetes    Arrhythmia Brother    Hemachromatosis Other        4 paternal uncles   Hemachromatosis Paternal Uncle    Social History   Socioeconomic History   Marital status: Married    Spouse name: Not on file   Number of children: Not on file   Years of education: Not on file   Highest education level: Not on file  Occupational History   Not on file  Tobacco Use   Smoking status: Never   Smokeless tobacco: Never  Vaping Use   Vaping Use: Never used  Substance and Sexual Activity   Alcohol use: Yes    Comment: socially   Drug use: No   Sexual activity: Yes    Partners: Female  Other Topics Concern   Not on file  Social History Narrative   Transferred from [employer] in 07/2018 to [employer]    Married no kids     He is middle of 5 kids       Wife DPR Hoyle Barrmanda Humiston [phone]   Social Determinants of Health   Financial Resource Strain: Not on file  Food Insecurity: Not on file  Transportation Needs: Not on file  Physical Activity: Not on file  Stress: Not on  file  Social Connections: Not on file  Intimate Partner Violence: Not on file   Current Meds  Medication Sig   triamcinolone cream (KENALOG) 0.1 % Apply 1 application topically as needed.   Allergies  Allergen Reactions   Sulfa Antibiotics Rash    "when I was a kid" rash    Recent Results (from the past 2160 hour(s))  Basic metabolic panel     Status: None   Collection Time: 06/10/21  4:29 AM  Result Value Ref Range   Sodium 140 135 - 145 mmol/L   Potassium 3.7 3.5 - 5.1 mmol/L   Chloride 109 98 - 111 mmol/L   CO2 25 22 - 32 mmol/L   Glucose, Bld 93 70 - 99 mg/dL    Comment: Glucose reference range applies only to samples taken after fasting for at least 8 hours.   BUN 17 6 - 20 mg/dL   Creatinine, Ser 2.87 0.61 - 1.24 mg/dL   Calcium 9.5 8.9 - 86.7 mg/dL   GFR, Estimated >67 >20 mL/min    Comment: (NOTE) Calculated using the CKD-EPI Creatinine Equation (2021)    Anion gap 6 5 - 15     Comment: Performed at [facility], [address] [place], [place] [address]  CBC     Status: None   Collection Time: 06/10/21  4:29 AM  Result Value Ref Range   WBC 6.5 4.0 - 10.5 K/uL   RBC 5.29 4.22 - 5.81 MIL/uL   Hemoglobin 15.7 13.0 - 17.0 g/dL   HCT 62.8 36.6 - 29.4 %   MCV 85.4 80.0 - 100.0 fL   MCH 29.7 26.0 - 34.0 pg   MCHC 34.7 30.0 - 36.0 g/dL   RDW 76.5 46.5 - 03.5 %   Platelets 284 150 - 400 K/uL   nRBC 0.0 0.0 - 0.2 %    Comment: Performed at [facility], [address], [place], [place] [address]  Troponin I (High Sensitivity)     Status: None   Collection Time: 06/10/21  4:29 AM  Result Value Ref Range   Troponin I (High Sensitivity) 4 <18 ng/L    Comment: (NOTE) Elevated high sensitivity troponin I (hsTnI) values and significant  changes across serial measurements may suggest ACS but many other  chronic and acute conditions are known to elevate hsTnI results.  Refer to the "Links" section for chest pain algorithms and additional  guidance. Performed at [facility], [address] [place], [place] [address]   Protime-INR (order if Patient is taking Coumadin / Warfarin)     Status: None   Collection Time: 06/10/21  4:29 AM  Result Value Ref Range   Prothrombin Time 13.8 11.4 - 15.2 seconds   INR 1.1 0.8 - 1.2    Comment: (NOTE) INR goal varies based on device and disease states. Performed at [facility], [address] [place], [place] [address]   TSH     Status: None   Collection Time: 06/10/21  4:29 AM  Result Value Ref Range   TSH 1.665 0.350 - 4.500 uIU/mL    Comment: Performed by a 3rd Generation assay with a functional sensitivity of <=0.01 uIU/mL. Performed at [facility], [address] [place], [place] [address]   T4, free     Status: None   Collection Time: 06/10/21  4:29 AM  Result Value Ref Range   Free T4 0.96 0.61 - 1.12 ng/dL    Comment: (NOTE) Biotin ingestion may  interfere  with free T4 tests. If the results are inconsistent with the TSH level, previous test results, or the clinical presentation, then consider biotin interference. If needed, order repeat testing after stopping biotin. Performed at [facility], [address] [place], [place] [address]   CK     Status: None   Collection Time: 06/10/21  4:29 AM  Result Value Ref Range   Total CK 214 49 - 397 U/L    Comment: Performed at [facility], [address] [place], [place] [address]  Troponin I (High Sensitivity)     Status: None   Collection Time: 06/10/21  6:37 AM  Result Value Ref Range   Troponin I (High Sensitivity) 4 <18 ng/L    Comment: (NOTE) Elevated high sensitivity troponin I (hsTnI) values and significant  changes across serial measurements may suggest ACS but many other  chronic and acute conditions are known to elevate hsTnI results.  Refer to the "Links" section for chest pain algorithms and additional  guidance. Performed at [facility], [address] [place], [place] [address]    Objective  Body mass index is 30.81 kg/m. Wt Readings from Last 3 Encounters:  06/18/21 227 lb 3.2 oz (103.1 kg)  06/10/21 227 lb 15.3 oz (103.4 kg)  04/10/21 225 lb (102.1 kg)   Temp Readings from Last 3 Encounters:  06/18/21 98 F (36.7 C) (Oral)  06/10/21 98.1 F (36.7 C)  04/09/21 (!) 97 F (36.1 C) (Temporal)   BP Readings from Last 3 Encounters:  06/18/21 122/78  06/10/21 119/84  04/10/21 114/80   Pulse Readings from Last 3 Encounters:  06/18/21 65  06/10/21 95  04/10/21 67    Physical Exam Vitals and nursing note reviewed.  Constitutional:      Appearance: Normal appearance. He is well-developed and well-groomed.  HENT:     Head: Normocephalic and atraumatic.  Eyes:     Conjunctiva/sclera: Conjunctivae normal.     Pupils: Pupils are equal, round, and reactive to light.  Cardiovascular:     Rate and Rhythm: Normal rate and regular  rhythm.     Heart sounds: Normal heart sounds.  Pulmonary:     Effort: Pulmonary effort is normal. No respiratory distress.     Breath sounds: Normal breath sounds.  Abdominal:     Tenderness: There is no abdominal tenderness.  Skin:    General: Skin is warm and moist.  Neurological:     General: No focal deficit present.     Mental Status: He is alert and oriented to person, place, and time. Mental status is at baseline.     Sensory: Sensation is intact.     Motor: Motor function is intact.     Coordination: Coordination is intact.     Gait: Gait is intact. Gait normal.  Psychiatric:        Attention and Perception: Attention and perception normal.        Mood and Affect: Mood and affect normal.        Speech: Speech normal.        Behavior: Behavior normal. Behavior is cooperative.        Thought Content: Thought content normal.        Cognition and Memory: Cognition and memory normal.        Judgment: Judgment normal.    Assessment  Plan  Palpitations Brugada syndrome Abnormal ECG 06/10/21 With recent palpitations needs holter   Acute pain of right knee - Plan:  DG Knee Complete 4 Views Right if future if pain returns  Positive dilute Russell's viper venom time test (DRVVT) - Plan: Lupus anticoagulant panel( [facility]) repeat negative Dr. Cathie Hoops does not rec further f/u  History of pulmonary embolism - Plan: Lupus anticoagulant panel( [facility]) History of deep vein thrombosis (DVT) of lower extremity  US 06/10/21 negative- Plan: Lupus anticoagulant panel( [facility])  Per Dr Cathie Hoops re h/o dvt/pe repeat antiphospholipid syndrome lab. just curious what brought this question up? does he have arrhythmia?  or any concerns of hypercoagulable state? I think it was a provoked case as he was hospitalized with CAP prior to the discovery of PE though pt states had been hiking before PE discovered no trauma  he had negative  hypercoagulable work up except prolonged DVRRT which could be due to being on DOACs.  lost follow up.  Will repeat lupus panel and if abnormal f/u h/o per Dr. Cathie Hoops vs [facility] anticoagulation clinic   ~approx  2cm x 2 cm x 2 cm Left kidney mass seen on [place]  Will order ct ab/pelvis with contrast to further work up  Hepatic steatosis with elevated liver enzymes  HM See 04/09/21 visit   Provider: Dr. French Ana McLean-Scocuzza-Internal Medicine

## 2021-06-18 NOTE — Patient Instructions (Addendum)
Call back for knee Xray if needed  Tylenol  Aspercream with with lidocaine  Or lidocaine pain patch    Remind lab that you need labs from 03/2021 and from today and you are fasting   Exercises for Chronic Knee Pain Chronic knee pain is pain that lasts longer than 3 months. For most people with chronic knee pain, exercise and weight loss is an important part of treatment. Your health care provider may want you to focus on: Strengthening the muscles that support your knee. This can take pressure off your knee and lessen pain. Preventing knee stiffness. Maintaining or increasing how far you can move your knee. Losing weight (if this applies) to take pressure off your knee, decrease your risk for injury, and make it easier for you to exercise. Your health care provider will help you develop an exercise program that matches your needs and physical abilities. Below are simple, low-impact exercises you can do at home. Ask your health care provider or a physical therapist how often you should do your exercise program and how many times to repeat each exercise. General safety tips Follow these safety tips for exercising with chronic knee pain: Get your health care provider's approval before doing any exercises. Start slowly and stop any time an exercise causes pain. Do not exercise if your knee pain is flaring up. Warm up first. Stretching a cold muscle can cause an injury. Do 5-10 minutes of easy movement or light stretching before beginning your exercise routine. Do 5-10 minutes of low-impact activity (like walking or cycling) before starting strengthening exercises. Contact your health care provider any time you have pain during or after exercising. Exercise may cause discomfort but should not be painful. It is normal to be a little stiff or sore after exercising.  Stretching and range-of-motion exercises Front thigh stretch  Stand up straight and support your body by holding on to a chair or  resting one hand on a wall. With your legs straight and close together, bend one knee to lift your heel up toward your buttocks. Using one hand for support, grab your ankle with your free hand. Pull your foot up closer toward your buttocks to feel the stretch in front of your thigh. Hold the stretch for 30 seconds. Repeat __________ times. Complete this exercise __________ times a day. Back thigh stretch  Sit on the floor with your back straight and your legs out straight in front of you. Place the palms of your hands on the floor and slide them toward your feet as you bend at the hip. Try to touch your nose to your knees and feel the stretch in the back of your thighs. Hold for 30 seconds. Repeat __________ times. Complete this exercise __________ times a day. Calf stretch  Stand facing a wall. Place the palms of your hands flat against the wall, arms extended, and lean slightly against the wall. Get into a lunge position with one leg bent at the knee and the other leg stretched out straight behind you. Keep both feet facing the wall and increase the bend in your knee while keeping the heel of the other leg flat on the ground. You should feel the stretch in your calf. Hold for 30 seconds. Repeat __________ times. Complete this exercise __________ times a day. Strengthening exercises Straight leg lift Lie on your back with one knee bent and the other leg out straight. Slowly lift the straight leg without bending the knee. Lift until your foot is about  12 inches (30 cm) off the floor. Hold for 3-5 seconds and slowly lower your leg. Repeat __________ times. Complete this exercise __________ times a day. Single leg dip Stand between two chairs and put both hands on the backs of the chairs for support. Extend one leg out straight with your body weight resting on the heel of the standing leg. Slowly bend your standing knee to dip your body to the level that is comfortable for you. Hold for  3-5 seconds. Repeat __________ times. Complete this exercise __________ times a day. Hamstring curls Stand straight, knees close together, facing the back of a chair. Hold on to the back of a chair with both hands. Keep one leg straight. Bend the other knee while bringing the heel up toward the buttock until the knee is bent at a 90-degree angle (right angle). Hold for 3-5 seconds. Repeat __________ times. Complete this exercise __________ times a day. Wall squat Stand straight with your back, hips, and head against a wall. Step forward one foot at a time with your back still against the wall. Your feet should be 2 feet (61 cm) from the wall at shoulder width. Keeping your back, hips, and head against the wall, slide down the wall to as close of a sitting position as you can get. Hold for 5-10 seconds, then slowly slide back up. Repeat __________ times. Complete this exercise __________ times a day. Step-ups Step up with one foot onto a sturdy platform or stool that is about 6 inches (15 cm) high. Face sideways with one foot on the platform and one on the ground. Place all your weight on the platform foot and lift your body off the ground until your knee extends. Let your other leg hang free to the side. Hold for 3-5 seconds then slowly lower your weight down to the floor foot. Repeat __________ times. Complete this exercise __________ times a day. Contact a health care provider if: Your exercise causes pain. Your pain is worse after you exercise. Your pain prevents you from doing your exercises. This information is not intended to replace advice given to you by your health care provider. Make sure you discuss any questions you have with your health care provider. Document Revised: 08/17/2019 Document Reviewed: 04/10/2019 [employer] Patient Education  2022 [employer]  Knee Exercises Ask your health care provider which exercises are safe for you. Do exercises exactly as told by your  health care provider and adjust them as directed. It is normal to feel mild stretching, pulling, tightness, or discomfort as you do these exercises. Stop right away if you feel sudden pain or your pain gets worse. Do not begin these exercises until told by your health care provider. Stretching and range-of-motion exercises These exercises warm up your muscles and joints and improve the movement and flexibility of your knee. These exercises also help to relieve pain and swelling. Knee extension, prone  Lie on your abdomen (prone position) on a bed. Place your left / right knee just beyond the edge of the surface so your knee is not on the bed. You can put a towel under your left / right thigh just above your kneecap for comfort. Relax your leg muscles and allow gravity to straighten your knee (extension). You should feel a stretch behind your left / right knee. Hold this position for __________ seconds. Scoot up so your knee is supported between repetitions. Repeat __________ times. Complete this exercise __________ times a day. Knee flexion, active  Lie  on your back with both legs straight. If this causes back discomfort, bend your left / right knee so your foot is flat on the floor. Slowly slide your left / right heel back toward your buttocks. Stop when you feel a gentle stretch in the front of your knee or thigh (flexion). Hold this position for __________ seconds. Slowly slide your left / right heel back to the starting position. Repeat __________ times. Complete this exercise __________ times a day. Quadriceps stretch, prone  Lie on your abdomen on a firm surface, such as a bed or padded floor. Bend your left / right knee and hold your ankle. If you cannot reach your ankle or pant leg, loop a belt around your foot and grab the belt instead. Gently pull your heel toward your buttocks. Your knee should not slide out to the side. You should feel a stretch in the front of your thigh and knee  (quadriceps). Hold this position for __________ seconds. Repeat __________ times. Complete this exercise __________ times a day. Hamstring, supine  Lie on your back (supine position). Loop a belt or towel over the ball of your left / right foot. The ball of your foot is on the walking surface, right under your toes. Straighten your left / right knee and slowly pull on the belt to raise your leg until you feel a gentle stretch behind your knee (hamstring). Do not let your knee bend while you do this. Keep your other leg flat on the floor. Hold this position for __________ seconds. Repeat __________ times. Complete this exercise __________ times a day. Strengthening exercises These exercises build strength and endurance in your knee. Endurance is the ability to use your muscles for a long time, even after they get tired. Quadriceps, isometric This exercise strengthens the muscles in front of your thigh (quadriceps) without moving your knee joint (isometric). Lie on your back with your left / right leg extended and your other knee bent. Put a rolled towel or small pillow under your knee if told by your health care provider. Slowly tense the muscles in the front of your left / right thigh. You should see your kneecap slide up toward your hip or see increased dimpling just above the knee. This motion will push the back of the knee toward the floor. For __________ seconds, hold the muscle as tight as you can without increasing your pain. Relax the muscles slowly and completely. Repeat __________ times. Complete this exercise __________ times a day. Straight leg raises This exercise strengthens the muscles in front of your thigh (quadriceps) and the muscles that move your hips (hip flexors). Lie on your back with your left / right leg extended and your other knee bent. Tense the muscles in the front of your left / right thigh. You should see your kneecap slide up or see increased dimpling just above  the knee. Your thigh may even shake a bit. Keep these muscles tight as you raise your leg 4-6 inches (10-15 cm) off the floor. Do not let your knee bend. Hold this position for __________ seconds. Keep these muscles tense as you lower your leg. Relax your muscles slowly and completely after each repetition. Repeat __________ times. Complete this exercise __________ times a day. Hamstring, isometric  Lie on your back on a firm surface. Bend your left / right knee about __________ degrees. Dig your left / right heel into the surface as if you are trying to pull it toward your buttocks. Tighten the  muscles in the back of your thighs (hamstring) to "dig" as hard as you can without increasing any pain. Hold this position for __________ seconds. Release the tension gradually and allow your muscles to relax completely for __________ seconds after each repetition. Repeat __________ times. Complete this exercise __________ times a day. Hamstring curls If told by your health care provider, do this exercise while wearing ankle weights. Begin with __________lb / kg weights. Then increase the weight by 1 lb (0.5 kg) increments. Do not wear ankle weights that are more than __________lb / kg. Lie on your abdomen with your legs straight. Bend your left / right knee as far as you can without feeling pain. Keep your hips flat against the floor. Hold this position for __________ seconds. Slowly lower your leg to the starting position. Repeat __________ times. Complete this exercise __________ times a day. Squats This exercise strengthens the muscles in front of your thigh and knee (quadriceps). Stand in front of a table, with your feet and knees pointing straight ahead. You may rest your hands on the table for balance but not for support. Slowly bend your knees and lower your hips like you are going to sit in a chair. Keep your weight over your heels, not over your toes. Keep your lower legs upright so they  are parallel with the table legs. Do not let your hips go lower than your knees. Do not bend lower than told by your health care provider. If your knee pain increases, do not bend as low. Hold the squat position for __________ seconds. Slowly push with your legs to return to standing. Do not use your hands to pull yourself to standing. Repeat __________ times. Complete this exercise __________ times a day. Wall slides This exercise strengthens the muscles in front of your thigh and knee (quadriceps). Lean your back against a smooth wall or door, and walk your feet out 18-24 inches (46-61 cm) from it. Place your feet hip-width apart. Slowly slide down the wall or door until your knees bend __________ degrees. Keep your knees over your heels, not over your toes. Keep your knees in line with your hips. Hold this position for __________ seconds. Repeat __________ times. Complete this exercise __________ times a day. Straight leg raises, side-lying This exercise strengthens the muscles that rotate the leg at the hip and move it away from your body (hip abductors). Lie on your side with your left / right leg in the top position. Lie so your head, shoulder, knee, and hip line up. You may bend your bottom knee to help you keep your balance. Roll your hips slightly forward so your hips are stacked directly over each other and your left / right knee is facing forward. Leading with your heel, lift your top leg 4-6 inches (10-15 cm). You should feel the muscles in your outer hip lifting. Do not let your foot drift forward. Do not let your knee roll toward the ceiling. Hold this position for __________ seconds. Slowly return your leg to the starting position. Let your muscles relax completely after each repetition. Repeat __________ times. Complete this exercise __________ times a day. Straight leg raises, prone This exercise stretches the muscles that move your hips away from the front of the pelvis  (hip extensors). Lie on your abdomen on a firm surface. You can put a pillow under your hips if that is more comfortable. Tense the muscles in your buttocks and lift your left / right leg about 4-6 inches (  10-15 cm). Keep your knee straight as you lift your leg. Hold this position for __________ seconds. Slowly lower your leg to the starting position. Let your leg relax completely after each repetition. Repeat __________ times. Complete this exercise __________ times a day. This information is not intended to replace advice given to you by your health care provider. Make sure you discuss any questions you have with your health care provider. Document Revised: 12/24/2020 Document Reviewed: 12/24/2020 Else[employer] Patient Education  2022 [employer]  Acute Knee Pain, Adult Acute knee pain is sudden and may be caused by damage, swelling, or irritation of the muscles and tissues that support the knee. Pain may result from: A fall. An injury to the knee from twisting motions. A hit to the knee. Infection. Acute knee pain may go away on its own with time and rest. If it does not, your health care provider may order tests to find the cause of the pain. These may include: Imaging tests, such as an X-ray, MRI, CT scan, or ultrasound. Joint aspiration. In this test, fluid is removed from the knee and evaluated. Arthroscopy. In this test, a lighted tube is inserted into the knee and an image is projected onto a TV screen. Biopsy. In this test, a sample of tissue is removed from the body and studied under a microscope. Follow these instructions at home: If you have a knee sleeve or brace:  Wear the knee sleeve or brace as told by your health care provider. Remove it only as told by your health care provider. Loosen it if your toes tingle, become numb, or turn cold and blue. Keep it clean. If the knee sleeve or brace is not waterproof: Do not let it get wet. Cover it with a watertight covering when  you take a bath or shower. Activity Rest your knee. Do not do things that cause pain or make pain worse. Avoid high-impact activities or exercises, such as running, jumping rope, or doing jumping jacks. Work with a physical therapist to make a safe exercise program, as recommended by your health care provider. Do exercises as told by your physical therapist. Managing pain, stiffness, and swelling  If directed, put ice on the affected knee. To do this: If you have a removable knee sleeve or brace, remove it as told by your health care provider. Put ice in a plastic bag. Place a towel between your skin and the bag. Leave the ice on for 20 minutes, 2-3 times a day. Remove the ice if your skin turns bright red. This is very important. If you cannot feel pain, heat, or cold, you have a greater risk of damage to the area. If directed, use an elastic bandage to put pressure (compression) on your injured knee. This may control swelling, give support, and help with discomfort. Raise (elevate) your knee above the level of your heart while you are sitting or lying down. Sleep with a pillow under your knee. General instructions Take over-the-counter and prescription medicines only as told by your health care provider. Do not use any products that contain nicotine or tobacco, such as cigarettes, e-cigarettes, and chewing tobacco. If you need help quitting, ask your health care provider. If you are overweight, work with your health care provider and a dietitian to set a weight-loss goal that is healthy and reasonable for you. Extra weight can put pressure on your knee. Pay attention to any changes in your symptoms. Keep all follow-up visits. This is important. Contact a health  care provider if: Your knee pain continues, changes, or gets worse. You have a fever along with knee pain. Your knee feels warm to the touch or is red. Your knee buckles or locks up. Get help right away if: Your knee swells, and  the swelling becomes worse. You cannot move your knee. You have severe pain in your knee that cannot be managed with pain medicine. Summary Acute knee pain can be caused by a fall, an injury, an infection, or damage, swelling, or irritation of the tissues that support your knee. Your health care provider may perform tests to find out the cause of the pain. Pay attention to any changes in your symptoms. Relieve your pain with rest, medicines, light activity, and the use of ice. Get help right away if your knee swells, you cannot move your knee, or you have severe pain that cannot be managed with medicine. This information is not intended to replace advice given to you by your health care provider. Make sure you discuss any questions you have with your health care provider. Document Revised: 09/27/2019 Document Reviewed: 09/27/2019 Elsevier Patient Education  2022 [employer]  Palpitations Palpitations are feelings that your heartbeat is irregular or is faster than normal. It may feel like your heart is fluttering or skipping a beat. Palpitations may be caused by many things, including smoking, caffeine, alcohol, stress, and certain medicines or drugs. Most causes of palpitations are not serious.  However, some palpitations can be a sign of a serious problem. Further tests and a thorough medical history will be done to find the cause of your palpitations. Your provider may order tests such as an ECG, labs, an echocardiogram, or an ambulatory continuous ECG monitor. Follow these instructions at home: Pay attention to any changes in your symptoms. Let your health care provider know about them. Take these actions to help manage your symptoms: Eating and drinking Follow instructions from your health care provider about eating or drinking restrictions. You may need to avoid foods and drinks that may cause palpitations. These may include: Caffeinated coffee, tea, soft drinks, and energy  drinks. Chocolate. Alcohol. Diet pills. Lifestyle   Take steps to reduce your stress and anxiety. Things that can help you relax include: Yoga. Mind-body activities, such as deep breathing, meditation, or using words and images to create positive thoughts (guided imagery). Physical activity, such as swimming, jogging, or walking. Tell your health care provider if your palpitations increase with activity. If you have chest pain or shortness of breath with activity, do not continue the activity until you are seen by your health care provider. Biofeedback. This is a method that helps you learn to use your mind to control things in your body, such as your heartbeat. Get plenty of rest and sleep. Keep a regular bed time. Do not use drugs, including cocaine or ecstasy. Do not use marijuana. Do not use any products that contain nicotine or tobacco. These products include cigarettes, chewing tobacco, and vaping devices, such as e-cigarettes. If you need help quitting, ask your health care provider. General instructions Take over-the-counter and prescription medicines only as told by your health care provider. Keep all follow-up visits. This is important. These may include visits for further testing if palpitations do not go away or get worse. Contact a health care provider if: You continue to have a fast or irregular heartbeat for a long period of time. You notice that your palpitations occur more often. Get help right away if: You have chest  pain or shortness of breath. You have a severe headache. You feel dizzy or you faint. These symptoms may represent a serious problem that is an emergency. Do not wait to see if the symptoms will go away. Get medical help right away. Call your local emergency services (911 in the [place] Do not drive yourself to the hospital. Summary Palpitations are feelings that your heartbeat is irregular or is faster than normal. It may feel like your heart is fluttering or  skipping a beat. Palpitations may be caused by many things, including smoking, caffeine, alcohol, stress, certain medicines, and drugs. Further tests and a thorough medical history may be done to find the cause of your palpitations. Get help right away if you faint or have chest pain, shortness of breath, severe headache, or dizziness. This information is not intended to replace advice given to you by your health care provider. Make sure you discuss any questions you have with your health care provider. Document Revised: 09/04/2020 Document Reviewed: 09/04/2020 Elsevier Patient Education  2022 [employer].

## 2021-06-19 ENCOUNTER — Other Ambulatory Visit (INDEPENDENT_AMBULATORY_CARE_PROVIDER_SITE_OTHER): Payer: BC Managed Care – PPO

## 2021-06-19 ENCOUNTER — Ambulatory Visit (INDEPENDENT_AMBULATORY_CARE_PROVIDER_SITE_OTHER): Payer: BC Managed Care – PPO

## 2021-06-19 ENCOUNTER — Telehealth: Payer: Self-pay | Admitting: Internal Medicine

## 2021-06-19 ENCOUNTER — Other Ambulatory Visit: Payer: Self-pay | Admitting: *Deleted

## 2021-06-19 ENCOUNTER — Encounter: Payer: Self-pay | Admitting: Internal Medicine

## 2021-06-19 ENCOUNTER — Other Ambulatory Visit: Payer: Self-pay

## 2021-06-19 DIAGNOSIS — Z86711 Personal history of pulmonary embolism: Secondary | ICD-10-CM | POA: Diagnosis not present

## 2021-06-19 DIAGNOSIS — Z0000 Encounter for general adult medical examination without abnormal findings: Secondary | ICD-10-CM | POA: Diagnosis not present

## 2021-06-19 DIAGNOSIS — R739 Hyperglycemia, unspecified: Secondary | ICD-10-CM | POA: Diagnosis not present

## 2021-06-19 DIAGNOSIS — Z1389 Encounter for screening for other disorder: Secondary | ICD-10-CM

## 2021-06-19 DIAGNOSIS — Z8349 Family history of other endocrine, nutritional and metabolic diseases: Secondary | ICD-10-CM

## 2021-06-19 DIAGNOSIS — R748 Abnormal levels of other serum enzymes: Secondary | ICD-10-CM | POA: Diagnosis not present

## 2021-06-19 DIAGNOSIS — R002 Palpitations: Secondary | ICD-10-CM

## 2021-06-19 DIAGNOSIS — E559 Vitamin D deficiency, unspecified: Secondary | ICD-10-CM

## 2021-06-19 DIAGNOSIS — Z1329 Encounter for screening for other suspected endocrine disorder: Secondary | ICD-10-CM | POA: Diagnosis not present

## 2021-06-19 DIAGNOSIS — Z13818 Encounter for screening for other digestive system disorders: Secondary | ICD-10-CM | POA: Diagnosis not present

## 2021-06-19 DIAGNOSIS — E611 Iron deficiency: Secondary | ICD-10-CM | POA: Diagnosis not present

## 2021-06-19 DIAGNOSIS — R791 Abnormal coagulation profile: Secondary | ICD-10-CM | POA: Diagnosis not present

## 2021-06-19 DIAGNOSIS — Z86718 Personal history of other venous thrombosis and embolism: Secondary | ICD-10-CM

## 2021-06-19 DIAGNOSIS — Z13220 Encounter for screening for lipoid disorders: Secondary | ICD-10-CM | POA: Diagnosis not present

## 2021-06-19 LAB — IBC + FERRITIN
Ferritin: 218.4 ng/mL (ref 22.0–322.0)
Iron: 64 ug/dL (ref 42–165)
Saturation Ratios: 20.2 % (ref 20.0–50.0)
TIBC: 316.4 ug/dL (ref 250.0–450.0)
Transferrin: 226 mg/dL (ref 212.0–360.0)

## 2021-06-19 LAB — TSH: TSH: 2.58 u[IU]/mL (ref 0.35–5.50)

## 2021-06-19 LAB — CBC WITH DIFFERENTIAL/PLATELET
Basophils Absolute: 0.1 10*3/uL (ref 0.0–0.1)
Basophils Relative: 1.2 % (ref 0.0–3.0)
Eosinophils Absolute: 0.2 10*3/uL (ref 0.0–0.7)
Eosinophils Relative: 4.3 % (ref 0.0–5.0)
HCT: 44.9 % (ref 39.0–52.0)
Hemoglobin: 15.4 g/dL (ref 13.0–17.0)
Lymphocytes Relative: 30 % (ref 12.0–46.0)
Lymphs Abs: 1.4 10*3/uL (ref 0.7–4.0)
MCHC: 34.4 g/dL (ref 30.0–36.0)
MCV: 86.1 fl (ref 78.0–100.0)
Monocytes Absolute: 0.4 10*3/uL (ref 0.1–1.0)
Monocytes Relative: 9.1 % (ref 3.0–12.0)
Neutro Abs: 2.5 10*3/uL (ref 1.4–7.7)
Neutrophils Relative %: 55.4 % (ref 43.0–77.0)
Platelets: 256 10*3/uL (ref 150.0–400.0)
RBC: 5.21 Mil/uL (ref 4.22–5.81)
RDW: 12.9 % (ref 11.5–15.5)
WBC: 4.5 10*3/uL (ref 4.0–10.5)

## 2021-06-19 LAB — VITAMIN D 25 HYDROXY (VIT D DEFICIENCY, FRACTURES): VITD: 16.99 ng/mL — ABNORMAL LOW (ref 30.00–100.00)

## 2021-06-19 LAB — COMPREHENSIVE METABOLIC PANEL
ALT: 95 U/L — ABNORMAL HIGH (ref 0–53)
AST: 56 U/L — ABNORMAL HIGH (ref 0–37)
Albumin: 4.6 g/dL (ref 3.5–5.2)
Alkaline Phosphatase: 67 U/L (ref 39–117)
BUN: 15 mg/dL (ref 6–23)
CO2: 27 mEq/L (ref 19–32)
Calcium: 9.7 mg/dL (ref 8.4–10.5)
Chloride: 107 mEq/L (ref 96–112)
Creatinine, Ser: 1.05 mg/dL (ref 0.40–1.50)
GFR: 93.98 mL/min (ref >60.00)
Glucose, Bld: 93 mg/dL (ref 70–99)
Potassium: 4.2 mEq/L (ref 3.5–5.1)
Sodium: 140 mEq/L (ref 135–145)
Total Bilirubin: 0.4 mg/dL (ref 0.2–1.2)
Total Protein: 7 g/dL (ref 6.0–8.3)

## 2021-06-19 LAB — LIPID PANEL
Cholesterol: 214 mg/dL — ABNORMAL HIGH (ref 0–200)
HDL: 36.2 mg/dL — ABNORMAL LOW (ref >39.00)
LDL Cholesterol: 146 mg/dL — ABNORMAL HIGH (ref 0–99)
NonHDL: 177.75
Total CHOL/HDL Ratio: 6
Triglycerides: 160 mg/dL — ABNORMAL HIGH (ref 0.0–149.0)
VLDL: 32 mg/dL (ref 0.0–40.0)

## 2021-06-19 LAB — HEMOGLOBIN A1C: Hgb A1c MFr Bld: 5.5 % (ref 4.6–6.5)

## 2021-06-19 NOTE — Telephone Encounter (Signed)
I called and spoke with the patient regarding him wearing a monitor as recommended below.  The patient voices understanding and is agreeable.  He is aware I will order this today and he should receive to his home address within 3-4 business days.  He is already scheduled to follow up with Dr. Graciela Husbands on 07/28/21.  Monitor ordered placed for a ZIO XT.

## 2021-06-19 NOTE — Telephone Encounter (Signed)
Secure chat message received from Dr. Graciela Husbands regarding a conversation with Dr. Judie Grieve:  Dr. Judie Grieve: went to ED 06/10/21 for palpitations ekg with brugada does he need f/u and for holter heart beat was irregular  may need heart monitor   Dr. Graciela Husbands: Merla Riches can we arrange Zio 14 day for palps and then a Tele health followup or OV which ever he prefers

## 2021-06-20 LAB — URINALYSIS, ROUTINE W REFLEX MICROSCOPIC
Bilirubin Urine: NEGATIVE
Glucose, UA: NEGATIVE
Hgb urine dipstick: NEGATIVE
Ketones, ur: NEGATIVE
Leukocytes,Ua: NEGATIVE
Nitrite: NEGATIVE
Protein, ur: NEGATIVE
Specific Gravity, Urine: 1.018 (ref 1.001–1.035)
pH: 6.5 (ref 5.0–8.0)

## 2021-06-20 LAB — HEPATITIS C ANTIBODY
Hepatitis C Ab: NONREACTIVE
SIGNAL TO CUT-OFF: 0.02 (ref <1.00)

## 2021-06-20 LAB — HEPATITIS B SURFACE ANTIBODY, QUANTITATIVE: Hep B S AB Quant (Post): >1000 m[IU]/mL (ref >=10)

## 2021-06-21 DIAGNOSIS — R002 Palpitations: Secondary | ICD-10-CM | POA: Diagnosis not present

## 2021-06-21 LAB — LUPUS ANTICOAGULANT PANEL
Dilute Viper Venom Time: 39.3 s (ref 0.0–47.0)
PTT Lupus Anticoagulant: 34.6 s (ref 0.0–43.5)

## 2021-06-24 ENCOUNTER — Telehealth: Payer: Self-pay | Admitting: Internal Medicine

## 2021-06-24 ENCOUNTER — Other Ambulatory Visit: Payer: Self-pay | Admitting: Internal Medicine

## 2021-06-24 DIAGNOSIS — R748 Abnormal levels of other serum enzymes: Secondary | ICD-10-CM

## 2021-06-24 NOTE — Telephone Encounter (Signed)
Lft pt vm to call ofc to sch [facility]. thanks ?

## 2021-06-27 ENCOUNTER — Other Ambulatory Visit: Payer: Self-pay

## 2021-06-27 ENCOUNTER — Ambulatory Visit
Admission: RE | Admit: 2021-06-27 | Discharge: 2021-06-27 | Disposition: A | Discharge status: Home / Self Care | Payer: BC Managed Care – PPO | Source: Ambulatory Visit | Attending: Internal Medicine | Admitting: Internal Medicine

## 2021-06-27 DIAGNOSIS — R748 Abnormal levels of other serum enzymes: Secondary | ICD-10-CM

## 2021-06-27 DIAGNOSIS — K760 Fatty (change of) liver, not elsewhere classified: Secondary | ICD-10-CM | POA: Diagnosis not present

## 2021-06-27 DIAGNOSIS — R7989 Other specified abnormal findings of blood chemistry: Secondary | ICD-10-CM | POA: Diagnosis not present

## 2021-06-30 ENCOUNTER — Encounter: Payer: Self-pay | Admitting: Internal Medicine

## 2021-06-30 DIAGNOSIS — N2889 Other specified disorders of kidney and ureter: Secondary | ICD-10-CM | POA: Insufficient documentation

## 2021-06-30 DIAGNOSIS — K760 Fatty (change of) liver, not elsewhere classified: Secondary | ICD-10-CM | POA: Insufficient documentation

## 2021-06-30 NOTE — Addendum Note (Signed)
Addended by: Quentin Ore on: 06/30/2021 09:19 AM   Modules accepted: Orders

## 2021-07-01 ENCOUNTER — Encounter: Payer: Self-pay | Admitting: Internal Medicine

## 2021-07-01 NOTE — Telephone Encounter (Signed)
Please advise 

## 2021-07-14 ENCOUNTER — Other Ambulatory Visit: Payer: Self-pay

## 2021-07-14 ENCOUNTER — Ambulatory Visit
Admission: RE | Admit: 2021-07-14 | Discharge: 2021-07-14 | Disposition: A | Discharge status: Home / Self Care | Payer: BC Managed Care – PPO | Source: Ambulatory Visit | Attending: Internal Medicine | Admitting: Internal Medicine

## 2021-07-14 DIAGNOSIS — N2889 Other specified disorders of kidney and ureter: Secondary | ICD-10-CM | POA: Diagnosis not present

## 2021-07-14 DIAGNOSIS — K760 Fatty (change of) liver, not elsewhere classified: Secondary | ICD-10-CM | POA: Diagnosis not present

## 2021-07-14 MED ORDER — IOHEXOL 300 MG/ML  SOLN
100.0000 mL | Freq: Once | INTRAMUSCULAR | Status: AC | PRN
  Administered 2021-07-14: 100 mL via INTRAVENOUS

## 2021-07-16 DIAGNOSIS — R002 Palpitations: Secondary | ICD-10-CM | POA: Diagnosis not present

## 2021-07-17 ENCOUNTER — Encounter: Payer: Self-pay | Admitting: Internal Medicine

## 2021-07-28 ENCOUNTER — Ambulatory Visit: Payer: BC Managed Care – PPO | Admitting: Internal Medicine

## 2021-08-27 ENCOUNTER — Ambulatory Visit: Payer: BC Managed Care – PPO | Admitting: Internal Medicine

## 2021-09-25 ENCOUNTER — Ambulatory Visit: Payer: BC Managed Care – PPO | Admitting: Internal Medicine

## 2021-09-25 ENCOUNTER — Encounter: Payer: Self-pay | Admitting: Internal Medicine

## 2021-09-25 VITALS — BP 110/80 | HR 72 | Ht 72.0 in | Wt 225.0 lb

## 2021-09-25 DIAGNOSIS — R002 Palpitations: Secondary | ICD-10-CM | POA: Diagnosis not present

## 2021-09-25 DIAGNOSIS — I498 Other specified cardiac arrhythmias: Secondary | ICD-10-CM

## 2021-09-25 NOTE — Patient Instructions (Signed)

## 2021-09-25 NOTE — Progress Notes (Signed)
      Patient Care Team: McLean-Scocuzza, Pasty Spillers, MD as PCP - General (Internal Medicine) Rickard Patience, MD as Consulting Physician (Hematology and Oncology)   HPI  Philip Houston is a 31 y.o. male Seen in the past for an abnormal Brugada ECG.  He was at that time without symptoms.  No restratification was indicated and antipruritic therapy was recommended. No syncope  Lightheadedness assoc temporally with the use of the new biologic for atopic dermatitis.  A quick literature review fails to identify lightheadedness and as a reported side effect.  Symptoms abated over about 10 minutes, did not abate with sitting and then resolved without recurring  Seen in the ER 2/23 for palpitations.  An event recorder was obtained demonstrating sinus rhythm associated with palpitations.  Pulm Embolism attributed to his having been in a car for 4 hours.  Hence, treated for 6 months and then stopped    Records and Results Reviewed   Past Medical History:  Diagnosis Date   Abnormal ECG    Brugada Type 1 pattern   Brugada syndrome    01/2017 presented after working out with lightheadedness, heart flutter and irregular HB   Eczema    Pulmonary embolism (HCC)    b/l in 08/2019    Past Surgical History:  Procedure Laterality Date   LACERATION REPAIR     right thumb 31 y.o     Current Meds  Medication Sig   triamcinolone cream (KENALOG) 0.1 % Apply 1 application topically as needed.    Allergies  Allergen Reactions   Sulfa Antibiotics Rash    "when I was a kid" rash       Review of Systems negative except from HPI and PMH  Physical Exam BP 110/80 (BP Location: Left Arm, Patient Position: Sitting, Cuff Size: Normal)   Pulse 72   Ht 6' (1.829 m)   Wt 225 lb (102.1 kg)   BMI 30.52 kg/m  Well developed and nourished in no acute distress HENT normal Neck supple with JVP-  flat   Clear Regular rate and rhythm, no murmurs or gallops Abd-soft with active BS No Clubbing cyanosis  edema Skin-warm and dry A & Oriented  Grossly normal sensory and motor function  ECG sinus at 72 Interval 17/11/38 Downsloping ST segment elevation in lead V1 and saddleback ST segment elevation in lead V2     CrCl cannot be calculated (Patient's most recent lab result is older than the maximum 21 days allowed.).   Assessment and  Plan Brugada ECG   Palpitations  Pulmonary embolism provoked versus unprovoked  Lightheadedness associate with a biologic for asthma   Palpitations were reviewed.  All sinus rhythm.  Some variability in heart rate. Has some exertional tachy palpitations.  Even with stairs.  But no significant lightheadedness.  We will follow.    Current medicines are reviewed at length with the patient today .  The patient does not  have concerns regarding medicines.

## 2021-11-11 ENCOUNTER — Telehealth: Payer: BC Managed Care – PPO | Admitting: Internal Medicine

## 2021-11-11 ENCOUNTER — Encounter: Payer: Self-pay | Admitting: Internal Medicine

## 2021-11-11 VITALS — Ht 72.0 in | Wt 220.0 lb

## 2021-11-11 DIAGNOSIS — Z6829 Body mass index (BMI) 29.0-29.9, adult: Secondary | ICD-10-CM | POA: Diagnosis not present

## 2021-11-11 DIAGNOSIS — R748 Abnormal levels of other serum enzymes: Secondary | ICD-10-CM | POA: Diagnosis not present

## 2021-11-11 DIAGNOSIS — K760 Fatty (change of) liver, not elsewhere classified: Secondary | ICD-10-CM

## 2021-11-11 DIAGNOSIS — E785 Hyperlipidemia, unspecified: Secondary | ICD-10-CM

## 2021-11-11 DIAGNOSIS — E559 Vitamin D deficiency, unspecified: Secondary | ICD-10-CM | POA: Diagnosis not present

## 2021-11-11 NOTE — Progress Notes (Signed)
Telephone Note  I connected with Philip Houston   on 11/11/21 at  2:40 PM EDT by telephone and verified that I am speaking with the correct person using two identifiers.  Location patient: [place] Location provider:work or home office Persons participating in the virtual visit: patient, provider  I discussed the limitations and requested verbal permission for telemedicine visit. The patient expressed understanding and agreed to proceed.   HPI:  Acute telemedicine visit for : Borderline obesity working out lost 7-8 lbs wt is 220  lbs and lowest in hs 170s lbs goal <200 or 185  He has been wt lifting x 1 week   -Pertinent past medical history: see below -Pertinent medication allergies: Allergies  Allergen Reactions   Sulfa Antibiotics Rash    "when I was a kid" rash    -COVID-19 vaccine status:  Immunization History  Administered Date(s) Administered   Hepatitis B 12/04/1998, 04/10/1999, 11/26/1999, 12/13/2012   Influenza,inj,Quad PF,6+ Mos 01/05/2020, 04/09/2021   PFIZER(Purple Top)SARS-COV-2 Vaccination 05/01/2019, 05/22/2019   Td 10/12/2017   Tdap 10/12/2017     ROS: See pertinent positives and negatives per HPI.  Past Medical History:  Diagnosis Date   Abnormal ECG    Brugada Type 1 pattern   Brugada syndrome    01/2017 presented after working out with lightheadedness, heart flutter and irregular HB   Eczema    Pulmonary embolism (HCC)    b/l in 08/2019    Past Surgical History:  Procedure Laterality Date   LACERATION REPAIR     right thumb 31 y.o      Current Outpatient Medications:    triamcinolone cream (KENALOG) 0.1 %, Apply 1 application topically as needed., Disp: , Rfl:   EXAM:  VITALS per patient if applicable:  GENERAL: alert, oriented, appears well and in no acute distress  PSYCH/NEURO: pleasant and cooperative, no obvious depression or anxiety, speech and thought processing grossly intact  ASSESSMENT AND PLAN:  Discussed the following  assessment and plan:  Elevated liver enzymes - Plan: Comprehensive metabolic panel, Lipid panel  Hyperlipidemia, unspecified hyperlipidemia type - Plan: Lipid panel  Vitamin D deficiency - Plan: Vitamin D (25 hydroxy)  BMI 29.0-29.9,adult Rec healthy diet and exercise  Increase cardio and reduce sugars   HM Flu shot 04/09/21  Pfizer 2/2 ? If will get further disc total 4 rec Tdap per pt had in 2019  Hep B immune per pt  rec healthy diet and exercise  Colonoscopy rec 43   -we discussed possible serious and likely etiologies, options for evaluation and workup, limitations of telemedicine visit vs in person visit, treatment, treatment risks and precautions. Pt is agreeable to treatment via telemedicine at this moment.     I discussed the assessment and treatment plan with the patient. The patient was provided an opportunity to ask questions and all were answered. The patient agreed with the plan and demonstrated an understanding of the instructions.    Time spent 20 min Bevelyn Buckles, MD

## 2021-11-11 NOTE — Patient Instructions (Signed)
Budget-Friendly Healthy Eating There are many ways to save money at the grocery store and continue to eat healthy. You can be successful if you: Plan meals according to your budget. Make a grocery list and only purchase food according to your grocery list. Prepare food yourself at home. What are tips for following this plan? Reading food labels Compare food labels between brand name foods and the store brand. Often the nutritional value is the same, but the store brand is lower cost. Look for products that do not have added sugar, fat, or salt (sodium). These often cost the same but are healthier for you. Products may be labeled as: Sugar-free. Nonfat. Low-fat. Sodium-free. Low-sodium. Look for lean ground beef labeled as at least 92% lean and 8% fat. Shopping  Buy only the items on your grocery list and go only to the areas of the store that have the items on your list. Use coupons only for foods and brands you normally buy. Avoid buying items you wouldn't normally buy simply because they are on sale. Check online and in newspapers for weekly deals. Buy healthy items from the bulk bins when available, such as herbs, spices, flour, pasta, nuts, and dried fruit. Buy fruits and vegetables that are in season. Prices are usually lower on in-season produce. Look at the unit price on the price tag. Use it to compare different brands and sizes to find out which item is the best deal. Choose healthy items that are often low-cost, such as carrots, potatoes, apples, bananas, and oranges. Dried or canned beans are a low-cost protein source. Buy in bulk and freeze extra food. Items you can buy in bulk include meats, fish, poultry, frozen fruits, and frozen vegetables. Avoid buying "ready-to-eat" foods, such as pre-cut fruits and vegetables and pre-made salads. If possible, shop around to discover where you can find the best prices. Consider other retailers such as dollar stores, larger [employer], local fruit and vegetable stands, and farmers markets. Do not shop when you are hungry. If you shop while hungry, it may be hard to stick to your list and budget. Resist impulse buying. Use your grocery list as your official plan for the week. Buy a variety of vegetables and fruits by purchasing fresh, frozen, and canned items. Look at the top and bottom shelves for deals. Foods at eye level (eye level of an adult or child) are usually more expensive. Be efficient with your time when shopping. The more time you spend at the store, the more money you are likely to spend. To save money when choosing more expensive foods like meats and dairy: Choose cheaper cuts of meat, such as bone-in chicken thighs and drumsticks instead of skinless and boneless chicken. When you are ready to prepare the chicken, you can remove the skin yourself to make it healthier. Choose lean meats like chicken or [place] instead of beef. Choose canned seafood, such as tuna, salmon, or sardines. Buy eggs as a low-cost source of protein. Buy dried beans and peas, such as lentils, split peas, or kidney beans instead of meats. Dried beans and peas are a good alternative source of protein. Buy the larger tubs of yogurt instead of individual-sized containers. Choose water instead of sodas and other sweetened beverages. Avoid buying chips, cookies, and other "junk food." These items are usually expensive and not healthy. Cooking Make extra food and freeze the extras in meal-sized containers or in individual portions for fast meals and snacks. Pre-cook on days when you have  extra time to prepare meals in advance. You can keep these meals in the fridge or freezer and reheat for a quick meal. When you come home from the grocery store, wash, peel, and cut fruits and vegetables so they are ready to use and eat. This will help reduce food waste. Meal planning Do not eat out or get fast food. Prepare food at home. Make a grocery  list and make sure to bring it with you to the store. If you have a smart phone, you could use your phone to create your shopping list. Plan meals and snacks according to a grocery list and budget you create. Use leftovers in your meal plan for the week. Look for recipes where you can cook once and make enough food for two meals. Prepare budget-friendly types of meals like stews, casseroles, and stir-fry dishes. Try some meatless meals or try "no cook" meals like salads. Make sure that half your plate is filled with fruits or vegetables. Choose from fresh, frozen, or canned fruits and vegetables. If eating canned, remember to rinse them before eating. This will remove any excess salt added for packaging. Summary Eating healthy on a budget is possible if you plan your meals according to your budget, purchase according to your budget and grocery list, and prepare food yourself. Tips for buying more food on a limited budget include buying generic brands, using coupons only for foods you normally buy, and buying healthy items from the bulk bins when available. Tips for buying cheaper food to replace expensive food include choosing cheaper, lean cuts of meat, and buying dried beans and peas. This information is not intended to replace advice given to you by your health care provider. Make sure you discuss any questions you have with your health care provider. Document Revised: 01/25/2020 Document Reviewed: 01/25/2020 Elsevier Patient Education  2023 Elsevier Inc.  Exercising to [employer] Getting regular exercise is important for everyone. It is especially important if you are overweight. Being overweight increases your risk of heart disease, stroke, diabetes, high blood pressure, and several types of cancer. Exercising, and reducing the calories you consume, can help you lose weight and improve fitness and health. Exercise can be moderate or vigorous intensity. To lose weight, most people need to do a  certain amount of moderate or vigorous-intensity exercise each week. How can exercise affect me? You lose weight when you exercise enough to burn more calories than you eat. Exercise also reduces body fat and builds muscle. The more muscle you have, the more calories you burn. Exercise also: Improves mood. Reduces stress and tension. Improves your overall fitness, flexibility, and endurance. Increases bone strength. Moderate-intensity exercise  Moderate-intensity exercise is any activity that gets you moving enough to burn at least three times more energy (calories) than if you were sitting. Examples of moderate exercise include: Walking a mile in 15 minutes. Doing light yard work. Biking at an easy pace. Most people should get at least 150 minutes of moderate-intensity exercise a week to maintain their body weight. Vigorous-intensity exercise Vigorous-intensity exercise is any activity that gets you moving enough to burn at least six times more calories than if you were sitting. When you exercise at this intensity, you should be working hard enough that you are not able to carry on a conversation. Examples of vigorous exercise include: Running. Playing a team sport, such as football, basketball, and soccer. Jumping rope. Most people should get at least 75 minutes a week of vigorous exercise to  maintain their body weight. What actions can I take to lose weight? The amount of exercise you need to lose weight depends on: Your age. The type of exercise. Any health conditions you have. Your overall physical ability. Talk to your health care provider about how much exercise you need and what types of activities are safe for you. Nutrition  Make changes to your diet as told by your health care provider or diet and nutrition specialist (dietitian). This may include: Eating fewer calories. Eating more protein. Eating less unhealthy fats. Eating a diet that includes fresh fruits and  vegetables, whole grains, low-fat dairy products, and lean protein. Avoiding foods with added fat, salt, and sugar. Drink plenty of water while you exercise to prevent dehydration or heat stroke. Activity Choose an activity that you enjoy and set realistic goals. Your health care provider can help you make an exercise plan that works for you. Exercise at a moderate or vigorous intensity most days of the week. The intensity of exercise may vary from person to person. You can tell how intense a workout is for you by paying attention to your breathing and heartbeat. Most people will notice their breathing and heartbeat get faster with more intense exercise. Do resistance training twice each week, such as: Push-ups. Sit-ups. Lifting weights. Using resistance bands. Getting short amounts of exercise can be just as helpful as long, structured periods of exercise. If you have trouble finding time to exercise, try doing these things as part of your daily routine: Get up, stretch, and walk around every 30 minutes throughout the day. Go for a walk during your lunch break. Park your car farther away from your destination. If you take public transportation, get off one stop early and walk the rest of the way. Make phone calls while standing up and walking around. Take the stairs instead of elevators or escalators. Wear comfortable clothes and shoes with good support. Do not exercise so much that you hurt yourself, feel dizzy, or get very short of breath. Where to find more information [place] Department of Health and Human Services: ThisPath.fi Centers for Disease Control and Prevention: FootballExhibition.com.br Contact a health care provider: Before starting a new exercise program. If you have questions or concerns about your weight. If you have a medical problem that keeps you from exercising. Get help right away if: You have any of the following while exercising: Injury. Dizziness. Difficulty breathing or  shortness of breath that does not go away when you stop exercising. Chest pain. Rapid heartbeat. These symptoms may represent a serious problem that is an emergency. Do not wait to see if the symptoms will go away. Get medical help right away. Call your local emergency services (911 in the [place] Do not drive yourself to the hospital. Summary Getting regular exercise is especially important if you are overweight. Being overweight increases your risk of heart disease, stroke, diabetes, high blood pressure, and several types of cancer. Losing weight happens when you burn more calories than you eat. Reducing the amount of calories you eat, and getting regular moderate or vigorous exercise each week, helps you lose weight. This information is not intended to replace advice given to you by your health care provider. Make sure you discuss any questions you have with your health care provider. Document Revised: 06/09/2020 Document Reviewed: 06/09/2020 Elsevier Patient Education  2023 [employer].

## 2021-11-14 ENCOUNTER — Other Ambulatory Visit (INDEPENDENT_AMBULATORY_CARE_PROVIDER_SITE_OTHER): Payer: BC Managed Care – PPO

## 2021-11-14 ENCOUNTER — Encounter: Payer: Self-pay | Admitting: Internal Medicine

## 2021-11-14 ENCOUNTER — Other Ambulatory Visit: Payer: Self-pay | Admitting: Internal Medicine

## 2021-11-14 DIAGNOSIS — E785 Hyperlipidemia, unspecified: Secondary | ICD-10-CM

## 2021-11-14 DIAGNOSIS — R748 Abnormal levels of other serum enzymes: Secondary | ICD-10-CM

## 2021-11-14 DIAGNOSIS — E559 Vitamin D deficiency, unspecified: Secondary | ICD-10-CM

## 2021-11-14 LAB — COMPREHENSIVE METABOLIC PANEL
ALT: 126 U/L — ABNORMAL HIGH (ref 0–53)
AST: 55 U/L — ABNORMAL HIGH (ref 0–37)
Albumin: 4.9 g/dL (ref 3.5–5.2)
Alkaline Phosphatase: 56 U/L (ref 39–117)
BUN: 16 mg/dL (ref 6–23)
CO2: 26 mEq/L (ref 19–32)
Calcium: 9.6 mg/dL (ref 8.4–10.5)
Chloride: 105 mEq/L (ref 96–112)
Creatinine, Ser: 1.02 mg/dL (ref 0.40–1.50)
GFR: 97.03 mL/min (ref >60.00)
Glucose, Bld: 95 mg/dL (ref 70–99)
Potassium: 4.1 mEq/L (ref 3.5–5.1)
Sodium: 140 mEq/L (ref 135–145)
Total Bilirubin: 1 mg/dL (ref 0.2–1.2)
Total Protein: 7.3 g/dL (ref 6.0–8.3)

## 2021-11-14 LAB — LIPID PANEL
Cholesterol: 238 mg/dL — ABNORMAL HIGH (ref 0–200)
HDL: 36.2 mg/dL — ABNORMAL LOW (ref >39.00)
LDL Cholesterol: 172 mg/dL — ABNORMAL HIGH (ref 0–99)
NonHDL: 201.46
Total CHOL/HDL Ratio: 7
Triglycerides: 149 mg/dL (ref 0.0–149.0)
VLDL: 29.8 mg/dL (ref 0.0–40.0)

## 2021-11-14 LAB — VITAMIN D 25 HYDROXY (VIT D DEFICIENCY, FRACTURES): VITD: 29.85 ng/mL — ABNORMAL LOW (ref 30.00–100.00)

## 2021-11-14 MED ORDER — EZETIMIBE 10 MG PO TABS: 10.0000 mg | ORAL_TABLET | Freq: Every day | ORAL | 3 refills | Status: DC

## 2021-11-17 NOTE — Addendum Note (Signed)
Addended by: Quentin Ore on: 11/17/2021 06:31 PM   Modules accepted: Orders

## 2022-04-23 ENCOUNTER — Encounter: Payer: BC Managed Care – PPO | Admitting: Internal Medicine

## 2022-05-11 ENCOUNTER — Ambulatory Visit: Payer: BC Managed Care – PPO | Admitting: Nurse Practitioner

## 2022-05-11 ENCOUNTER — Encounter: Payer: Self-pay | Admitting: Nurse Practitioner

## 2022-05-11 VITALS — BP 118/72 | HR 75 | Ht 72.0 in | Wt 225.0 lb

## 2022-05-11 DIAGNOSIS — E7800 Pure hypercholesterolemia, unspecified: Secondary | ICD-10-CM

## 2022-05-11 DIAGNOSIS — Z0000 Encounter for general adult medical examination without abnormal findings: Secondary | ICD-10-CM

## 2022-05-11 DIAGNOSIS — L309 Dermatitis, unspecified: Secondary | ICD-10-CM | POA: Diagnosis not present

## 2022-05-11 DIAGNOSIS — I498 Other specified cardiac arrhythmias: Secondary | ICD-10-CM

## 2022-05-11 DIAGNOSIS — E559 Vitamin D deficiency, unspecified: Secondary | ICD-10-CM

## 2022-05-11 DIAGNOSIS — K760 Fatty (change of) liver, not elsewhere classified: Secondary | ICD-10-CM | POA: Diagnosis not present

## 2022-05-11 DIAGNOSIS — E785 Hyperlipidemia, unspecified: Secondary | ICD-10-CM

## 2022-05-11 DIAGNOSIS — E669 Obesity, unspecified: Secondary | ICD-10-CM | POA: Insufficient documentation

## 2022-05-11 MED ORDER — EZETIMIBE 10 MG PO TABS: 10.0000 mg | ORAL_TABLET | Freq: Every day | ORAL | 0 refills | Status: DC

## 2022-05-11 NOTE — Assessment & Plan Note (Signed)
Noted on previous labs.  Patient is already needs a refill.  Refill sent in for 90 days pending labs

## 2022-05-11 NOTE — Assessment & Plan Note (Signed)
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  Patient needs flu vaccine but we are out in office.  Tetanus vaccine is up-to-date.  Patient is too young for CRC screening, prostate cancer screening.  Patient was given information at discharge about preventative healthcare maintenance with his age range also includes anticipatory guidance.

## 2022-05-11 NOTE — Assessment & Plan Note (Signed)
Pending vitamin D lab 

## 2022-05-11 NOTE — Assessment & Plan Note (Signed)
Incidental finding on CT scan.  Patient is working on some currently on [employer]

## 2022-05-11 NOTE — Progress Notes (Signed)
Established Patient Office Visit  Subjective   Patient ID: Philip Houston, male    DOB: [birth year]  Age: 32 y.o. MRN: [id number]  Chief Complaint  Patient presents with   Establish Care    HPI for complete physical and follow up of chronic conditions.  Brugada: States that he was followed by Cardiology. Just PRN  Hepatic steatosis: noted on a ct scan done in 06/2021  HLD: he is currently maintained on Zetia    Immunizations: -Tetanus: Completed in 2019 -Influenza: None in office today   -Shingles: too young -Pneumonia: too young  -HPV:  Diet: Fair diet States 3 meals a day with some snacks. Lots of coffee, cut out sugary drinks. He will use splenda or a zero soda. water  Exercise: . States that supply a gym membership. States that he will go on nights that he works nights. 2 nights a week. For 30 mins cardi and weights   Eye exam: PRN Dental exam: needs updating     Colonoscopy: Too young, currently average risk Lung Cancer Screening: N/A Dexa: N/A  PSA: Too young, currently average risk  Sleep: states if at night goes in at 6 a to 6 p and will get up around 11-12. States days he will go to bed around 10 and get up ar 445. Feels more rested on day shift. Does not snore    Chest tenderness: Patient states he noticed some right lower chest chest tenderness where he wears his equipment/vest at work.  It is tender to palpation.  No other associated symptoms   Review of Systems  Constitutional:  Negative for chills and fever.  Respiratory:  Negative for shortness of breath.   Cardiovascular:  Negative for chest pain and leg swelling.  Gastrointestinal:  Negative for abdominal pain, blood in stool, constipation, diarrhea, nausea and vomiting.       BM daily   Genitourinary:  Negative for dysuria and hematuria.  Neurological:  Negative for tingling and headaches.  Psychiatric/Behavioral:  Negative for hallucinations and suicidal ideas.       Objective:     BP  118/72   Pulse 75   Ht 6' (1.829 m)   Wt 225 lb (102.1 kg)   SpO2 95%   BMI 30.52 kg/m  BP Readings from Last 3 Encounters:  05/11/22 118/72  09/25/21 110/80  06/18/21 122/78   Wt Readings from Last 3 Encounters:  05/11/22 225 lb (102.1 kg)  11/11/21 220 lb (99.8 kg)  09/25/21 225 lb (102.1 kg)      Physical Exam Vitals and nursing note reviewed. Exam conducted with a chaperone present Pricilla Handler, CMA).  Constitutional:      Appearance: Normal appearance.  HENT:     Right Ear: Tympanic membrane, ear canal and external ear normal.     Left Ear: Tympanic membrane, ear canal and external ear normal.     Mouth/Throat:     Mouth: Mucous membranes are moist.     Pharynx: Oropharynx is clear.  Eyes:     Extraocular Movements: Extraocular movements intact.     Pupils: Pupils are equal, round, and reactive to light.  Cardiovascular:     Rate and Rhythm: Normal rate and regular rhythm.     Pulses: Normal pulses.     Heart sounds: Normal heart sounds.  Pulmonary:     Effort: Pulmonary effort is normal.     Breath sounds: Normal breath sounds.  Chest:     Chest wall: Tenderness present.  Abdominal:  General: Bowel sounds are normal. There is no distension.     Palpations: There is no mass.     Tenderness: There is no abdominal tenderness.     Hernia: No hernia is present. There is no hernia in the left inguinal area or right inguinal area.  Genitourinary:    Penis: Normal.      Testes: Normal.     Epididymis:     Right: Normal.     Left: Normal.  Musculoskeletal:     Right lower leg: No edema.     Left lower leg: No edema.  Lymphadenopathy:     Cervical: No cervical adenopathy.     Lower Body: No right inguinal adenopathy. No left inguinal adenopathy.  Skin:    General: Skin is warm.  Neurological:     General: No focal deficit present.     Mental Status: He is alert.     Deep Tendon Reflexes:     Reflex Scores:      Bicep reflexes are 2+ on the right  side and 2+ on the left side.      Patellar reflexes are 2+ on the right side and 2+ on the left side.    Comments: Bilateral upper and lower extremity strength 5/5  Psychiatric:        Mood and Affect: Mood normal.        Behavior: Behavior normal.        Thought Content: Thought content normal.        Judgment: Judgment normal.      No results found for any visits on 05/11/22.    The ASCVD Risk score (Arnett DK, et al., 2019) failed to calculate for the following reasons:   The 2019 ASCVD risk score is only valid for ages 30 to 49    Assessment & Plan:   Problem List Items Addressed This Visit       Cardiovascular and Mediastinum   Brugada syndrome    Has been evaluated by cardiology.  Sees them on a as needed basis      Relevant Medications   ezetimibe (ZETIA) 10 MG tablet     Digestive   Hepatic steatosis    Incidental finding on CT scan.  Patient is working on some currently on Zetia        Musculoskeletal and Integument   Eczema    Will use triamcinolone cream on a as needed basis.        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  Patient needs flu vaccine but we are out in office.  Tetanus vaccine is up-to-date.  Patient is too young for CRC screening, prostate cancer screening.  Patient was given information at discharge about preventative healthcare maintenance with his age range also includes anticipatory guidance.      Relevant Orders   CBC   Lipid panel   Comprehensive metabolic panel   Hyperlipidemia    Noted on previous labs.  Patient is already needs a refill.  Refill sent in for 90 days pending labs      Relevant Medications   ezetimibe (ZETIA) 10 MG tablet   Other Relevant Orders   Lipid panel   Hemoglobin A1c   Vitamin D deficiency    Pending vitamin D lab      Relevant Orders   VITAMIN D 25 Hydroxy (Vit-D Deficiency, Fractures)   Obesity (BMI 30-39.9)    Work on healthy  lifestyle modifications.       Return in about 1 year (around 05/12/2023) for CPE and Labs.    Romilda Garret, NP

## 2022-05-11 NOTE — Assessment & Plan Note (Signed)
Will use triamcinolone cream on a as needed basis.

## 2022-05-11 NOTE — Assessment & Plan Note (Signed)
Has been evaluated by cardiology.  Sees them on a as needed basis

## 2022-05-11 NOTE — Patient Instructions (Addendum)
Nice to see you today I will be in touch with the labs once I have them Follow up with me in 1 year for your next physical, sooner if you need me Make a fasting lab appointment over the next 2 weeks when you leave today

## 2022-05-11 NOTE — Assessment & Plan Note (Signed)
Work on healthy lifestyle modifications. 

## 2022-05-25 ENCOUNTER — Other Ambulatory Visit (INDEPENDENT_AMBULATORY_CARE_PROVIDER_SITE_OTHER): Payer: BC Managed Care – PPO

## 2022-05-25 DIAGNOSIS — E559 Vitamin D deficiency, unspecified: Secondary | ICD-10-CM

## 2022-05-25 DIAGNOSIS — E7800 Pure hypercholesterolemia, unspecified: Secondary | ICD-10-CM

## 2022-05-25 DIAGNOSIS — Z0000 Encounter for general adult medical examination without abnormal findings: Secondary | ICD-10-CM

## 2022-05-25 LAB — LIPID PANEL
Cholesterol: 192 mg/dL (ref 0–200)
HDL: 36.6 mg/dL — ABNORMAL LOW (ref >39.00)
LDL Cholesterol: 126 mg/dL — ABNORMAL HIGH (ref 0–99)
NonHDL: 155.33
Total CHOL/HDL Ratio: 5
Triglycerides: 146 mg/dL (ref 0.0–149.0)
VLDL: 29.2 mg/dL (ref 0.0–40.0)

## 2022-05-25 LAB — CBC
HCT: 45.8 % (ref 39.0–52.0)
Hemoglobin: 16.3 g/dL (ref 13.0–17.0)
MCHC: 35.6 g/dL (ref 30.0–36.0)
MCV: 85.7 fl (ref 78.0–100.0)
Platelets: 276 10*3/uL (ref 150.0–400.0)
RBC: 5.34 Mil/uL (ref 4.22–5.81)
RDW: 13.4 % (ref 11.5–15.5)
WBC: 4.6 10*3/uL (ref 4.0–10.5)

## 2022-05-25 LAB — COMPREHENSIVE METABOLIC PANEL
ALT: 217 U/L — ABNORMAL HIGH (ref 0–53)
AST: 87 U/L — ABNORMAL HIGH (ref 0–37)
Albumin: 4.6 g/dL (ref 3.5–5.2)
Alkaline Phosphatase: 60 U/L (ref 39–117)
BUN: 12 mg/dL (ref 6–23)
CO2: 24 mEq/L (ref 19–32)
Calcium: 9.5 mg/dL (ref 8.4–10.5)
Chloride: 107 mEq/L (ref 96–112)
Creatinine, Ser: 0.99 mg/dL (ref 0.40–1.50)
GFR: 100.2 mL/min (ref >60.00)
Glucose, Bld: 97 mg/dL (ref 70–99)
Potassium: 4.3 mEq/L (ref 3.5–5.1)
Sodium: 140 mEq/L (ref 135–145)
Total Bilirubin: 0.7 mg/dL (ref 0.2–1.2)
Total Protein: 7.1 g/dL (ref 6.0–8.3)

## 2022-05-25 LAB — HEMOGLOBIN A1C: Hgb A1c MFr Bld: 5.6 % (ref 4.6–6.5)

## 2022-05-25 LAB — VITAMIN D 25 HYDROXY (VIT D DEFICIENCY, FRACTURES): VITD: 29 ng/mL — ABNORMAL LOW (ref 30.00–100.00)

## 2022-05-26 ENCOUNTER — Encounter: Payer: Self-pay | Admitting: Nurse Practitioner

## 2022-05-26 DIAGNOSIS — R748 Abnormal levels of other serum enzymes: Secondary | ICD-10-CM

## 2022-05-26 DIAGNOSIS — K760 Fatty (change of) liver, not elsewhere classified: Secondary | ICD-10-CM

## 2022-05-27 ENCOUNTER — Ambulatory Visit
Admission: EM | Admit: 2022-05-27 | Discharge: 2022-05-27 | Disposition: A | Discharge status: Home / Self Care | Payer: No Typology Code available for payment source | Attending: Emergency Medicine | Admitting: Emergency Medicine

## 2022-05-27 DIAGNOSIS — M7918 Myalgia, other site: Secondary | ICD-10-CM

## 2022-05-27 MED ORDER — CYCLOBENZAPRINE HCL 10 MG PO TABS: 10.0000 mg | ORAL_TABLET | Freq: Two times a day (BID) | ORAL | 0 refills | Status: AC | PRN
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
## 2022-05-27 NOTE — Discharge Instructions (Addendum)
Take Tylenol or ibuprofen as needed for discomfort.  Take the muscle relaxer as needed for muscle spasm; Do not drive, operate machinery, or drink alcohol with this medication as it can cause drowsiness.   Follow up with your primary care provider if your symptoms are not improving.     
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
## 2022-06-17 ENCOUNTER — Encounter: Payer: Self-pay | Admitting: Nurse Practitioner

## 2022-08-08 ENCOUNTER — Other Ambulatory Visit: Payer: Self-pay | Admitting: Nurse Practitioner

## 2022-08-08 DIAGNOSIS — E785 Hyperlipidemia, unspecified: Secondary | ICD-10-CM

## 2022-09-23 ENCOUNTER — Encounter: Payer: Self-pay | Admitting: Gastroenterology

## 2022-09-23 ENCOUNTER — Ambulatory Visit (INDEPENDENT_AMBULATORY_CARE_PROVIDER_SITE_OTHER): Payer: BC Managed Care – PPO | Admitting: Gastroenterology

## 2022-09-23 VITALS — BP 141/81 | HR 62 | Temp 97.9°F | Ht 72.0 in | Wt 221.0 lb

## 2022-09-23 DIAGNOSIS — R7989 Other specified abnormal findings of blood chemistry: Secondary | ICD-10-CM

## 2022-09-23 NOTE — Progress Notes (Signed)
Philip Repress, MD [address]  [address]  [place], [place] [address]  Main: [phone]  Fax: [phone]    Gastroenterology Consultation  Referring Provider:     Eden Emms, NP Primary Care Physician:  Eden Emms, NP Primary Gastroenterologist:  Dr. Arlyss Houston Reason for Consultation: Elevated LFTs        HPI:   Philip Houston is a 32 y.o. male referred by Dr. Toney Reil, Genene Churn, NP  for consultation & management of elevated LFTs.  Patient originally had mildly elevated ALT in 08/2019, was normal, later increased in 12/2019, AST along with ALT started to rise since 05/2021.  Most recent LFTs from 04/2022 revealed ALT 217, AST 87, normal alkaline phosphatase and T. bili.  Patient admits to consuming red meat every other day.  His BMI is 30, tries to exercise about twice a week and states that he does not eat very healthy.  He had an ultrasound of the liver which revealed hepatic steatosis.  He does not drink alcohol, does not smoke.  His uncle has history of iron overload and he passed away, he does not know the exact etiology of his death.    Patient had history of PE in 2021, treated with Eliquis for 6 months, evaluated by oncology  NSAIDs: None  Antiplts/Anticoagulants/Anti thrombotics: None  GI Procedures: None  Past Medical History:  Diagnosis Date   Abnormal ECG    Brugada Type 1 pattern   Brugada syndrome    01/2017 presented after working out with lightheadedness, heart flutter and irregular HB   Eczema    Pulmonary embolism ([facility])    b/l in 08/2019    Past Surgical History:  Procedure Laterality Date   LACERATION REPAIR     right thumb 32 y.o      Current Outpatient Medications:    cyclobenzaprine (FLEXERIL) 10 MG tablet, Take 1 tablet (10 mg total) by mouth 2 (two) times daily as needed for muscle spasms., Disp: 20 tablet, Rfl: 0   ezetimibe (ZETIA) 10 MG tablet, TAKE 1 TABLET(10 MG) BY MOUTH DAILY, Disp: 90 tablet, Rfl: 2   triamcinolone cream  (KENALOG) 0.1 %, Apply 1 application topically as needed., Disp: , Rfl:    Family History  Problem Relation Age of Onset   Diabetes Mother    Diabetes Father    Other Father        prediabetes   Thyroid disease Sister    Arrhythmia Brother    Hemachromatosis Paternal Uncle    Hemachromatosis Other        4 paternal uncles     Social History   Tobacco Use   Smoking status: Never   Smokeless tobacco: Never  Vaping Use   Vaping Use: Never used  Substance Use Topics   Alcohol use: Yes    Comment: socially   Drug use: No    Allergies as of 09/23/2022 - Review Complete 09/23/2022  Allergen Reaction Noted   Sulfa antibiotics Rash 12/23/2016    Review of Systems:    All systems reviewed and negative except where noted in HPI.   Physical Exam:  BP (!) 141/81 (BP Location: Right Arm, Patient Position: Sitting, Cuff Size: Normal)   Pulse 62   Temp 97.9 F (36.6 C) (Oral)   Ht 6' (1.829 m)   Wt 221 lb (100.2 kg)   BMI 29.97 kg/m  No LMP for male patient.  General:   Alert,  Well-developed, well-nourished, pleasant and  cooperative in NAD Head:  Normocephalic and atraumatic. Eyes:  Sclera clear, no icterus.   Conjunctiva pink. Ears:  Normal auditory acuity. Nose:  No deformity, discharge, or lesions. Mouth:  No deformity or lesions,oropharynx pink & moist. Neck:  Supple; no masses or thyromegaly. Lungs:  Respirations even and unlabored.  Clear throughout to auscultation.   No wheezes, crackles, or rhonchi. No acute distress. Heart:  Regular rate and rhythm; no murmurs, clicks, rubs, or gallops. Abdomen:  Normal bowel sounds. Soft, non-tender and non-distended without masses, hepatosplenomegaly or hernias noted.  No guarding or rebound tenderness.   Rectal: Not performed Msk:  Symmetrical without gross deformities. Good, equal movement & strength bilaterally. Pulses:  Normal pulses noted. Extremities:  No clubbing or edema.  No cyanosis. Neurologic:  Alert and oriented  x3;  grossly normal neurologically. Skin:  Intact without significant lesions or rashes. No jaundice. Psych:  Alert and cooperative. Normal mood and affect.  Imaging Studies: Reviewed  Assessment and Plan:   Philip Houston is a 32 y.o. male with prior history of provoked PE in 2021 s/p 6 months of anticoagulation with Eliquis, is seen in consultation for chronically elevated LFTs since 2023  Elevated LFTs with family history of iron overload syndrome HCV antibody, hepatitis B surface antibody negative Serum ferritin levels were normal in the past TSH normal Discussed about rest of the secondary liver disease workup and possible liver biopsy, patient is agreeable Advised patient to cut down on red meat consumption Discussed about healthy lifestyle, incorporate balanced diet as well as 5 times a week exercise   Follow up in 3 months with PA-C, Lavonna Monarch, MD

## 2022-09-24 LAB — CELIAC DISEASE PANEL: IgA/Immunoglobulin A, Serum: 221 mg/dL (ref 90–386)

## 2022-09-24 LAB — HEPATITIS A ANTIBODY, TOTAL: hep A Total Ab: NEGATIVE

## 2022-09-24 LAB — MITOCHONDRIAL ANTIBODIES: Mitochondrial Ab: <20 Units (ref 0.0–20.0)

## 2022-09-24 LAB — IRON,TIBC AND FERRITIN PANEL: Total Iron Binding Capacity: 302 ug/dL (ref 250–450)

## 2022-09-25 LAB — IRON,TIBC AND FERRITIN PANEL: Ferritin: 429 ng/mL — ABNORMAL HIGH (ref 30–400)

## 2022-09-25 LAB — ANTI-SMOOTH MUSCLE ANTIBODY, IGG: Smooth Muscle Ab: 4 Units (ref 0–19)

## 2022-09-25 LAB — CELIAC DISEASE PANEL: Endomysial IgA: NEGATIVE

## 2022-09-26 LAB — IRON,TIBC AND FERRITIN PANEL
Iron Saturation: 31 % (ref 15–55)
Iron: 95 ug/dL (ref 38–169)
UIBC: 207 ug/dL (ref 111–343)

## 2022-10-08 NOTE — Telephone Encounter (Signed)
-----   Message from Denman George, CMA sent at 10/08/2022  3:29 PM EDT ----- [place] called lab core and it usually takes 7 days to process but it is on a extra 16 days delay. She said if it was urgent she could get transfer to someone so they could expedite it. Informed her to expedite it  ----- Message ----- From: Toney Reil, MD Sent: 10/08/2022   3:15 PM EDT To: Denman George, CMA  Morrie Sheldon  Could you please check on these results? it has been almost 2 weeks  Thanks RV

## 2022-10-13 LAB — ANA: ANA Titer 1: NEGATIVE

## 2022-10-13 LAB — CERULOPLASMIN: Ceruloplasmin: 20.9 mg/dL (ref 16.0–31.0)

## 2022-10-13 LAB — HEPATITIS B CORE ANTIBODY, TOTAL: Hep B Core Total Ab: NEGATIVE

## 2022-10-13 LAB — HEMOCHROMATOSIS DNA-PCR(C282Y,H63D)

## 2022-10-13 LAB — ALPHA-1-ANTITRYPSIN: A-1 Antitrypsin: 88 mg/dL — ABNORMAL LOW (ref 95–164)

## 2022-10-14 ENCOUNTER — Telehealth: Payer: Self-pay

## 2022-10-14 DIAGNOSIS — R7989 Other specified abnormal findings of blood chemistry: Secondary | ICD-10-CM

## 2022-10-14 DIAGNOSIS — E8801 Alpha-1-antitrypsin deficiency: Secondary | ICD-10-CM

## 2022-10-14 NOTE — Telephone Encounter (Signed)
Called and patient verbalized understanding of results. He is okay with getting the Ultrasound liver biopsy. Put in the order in epic and faxed to [facility] at [phone].  Valla Leaver at [phone] and she will get patient scheduled and let me know when they are scheduled

## 2022-10-14 NOTE — Telephone Encounter (Signed)
-----   Message from Toney Reil, MD sent at 10/13/2022  3:43 PM EDT ----- I recommend ultrasound-guided liver biopsy and recheck LFTs DX: Elevated LFTs, low alpha-1 antitrypsin levels  RV

## 2022-10-16 NOTE — Telephone Encounter (Signed)
Per Marylu Lund is [facility] I called him and he declined. [place]?? He said that he would let you all know about him moving. Thx

## 2022-12-30 ENCOUNTER — Ambulatory Visit: Payer: BC Managed Care – PPO | Admitting: Physician Assistant

## 2023-02-16 IMAGING — US US ABDOMEN COMPLETE
1 series · 13 of 25 positions shown · non-contrast
Comparison: June 11, 2011.

CLINICAL DATA: elevated lfts

EXAM:
ABDOMEN ULTRASOUND COMPLETE

[Series 1: us abdomen complete · 0.26mm/px · 13 of 82 slices shown]
[im 1/82]
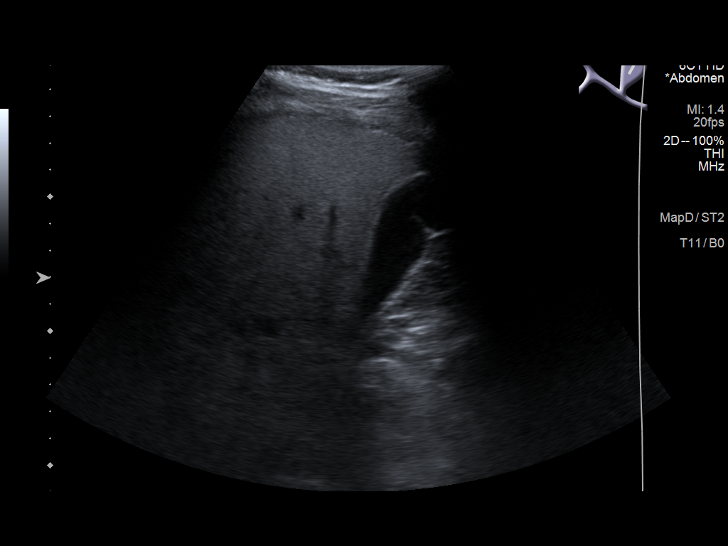
[im 7/82]
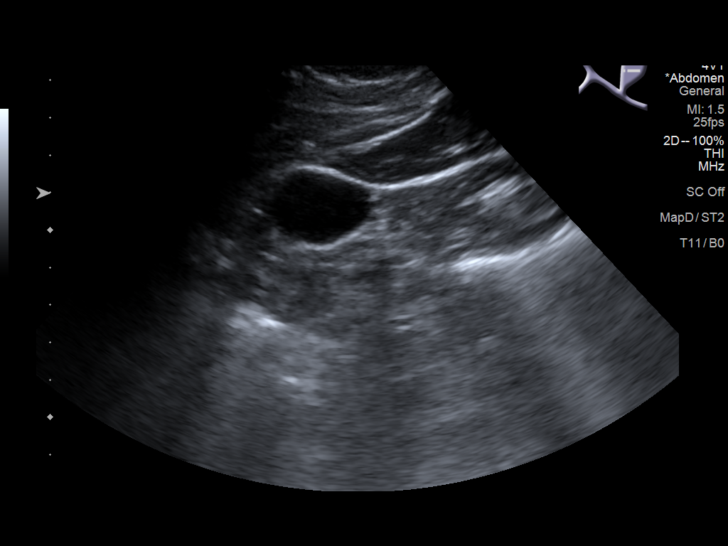
[im 14/82]
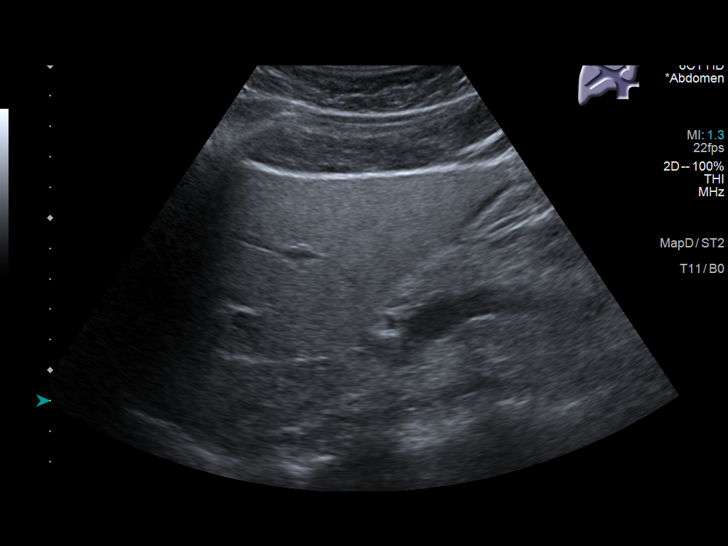
[im 21/82]
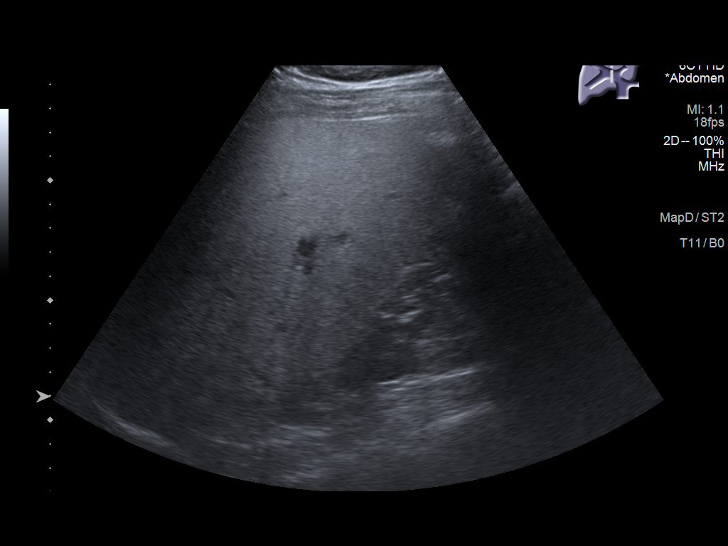
[im 28/82]
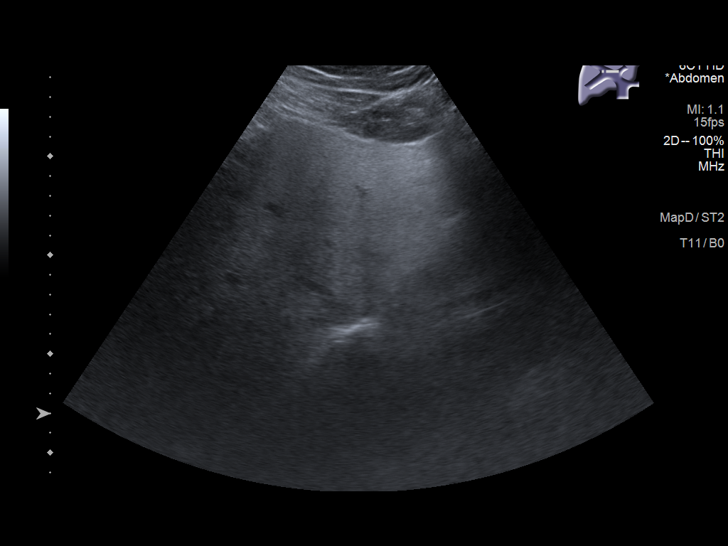
[im 34/82]
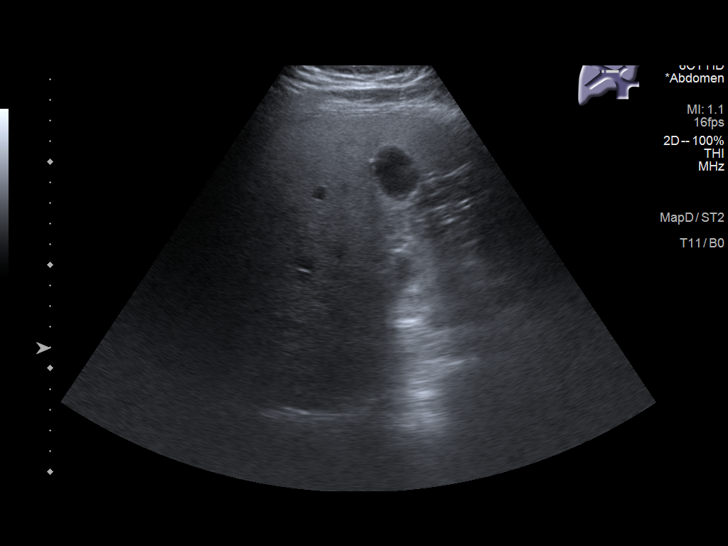
[im 41/82]
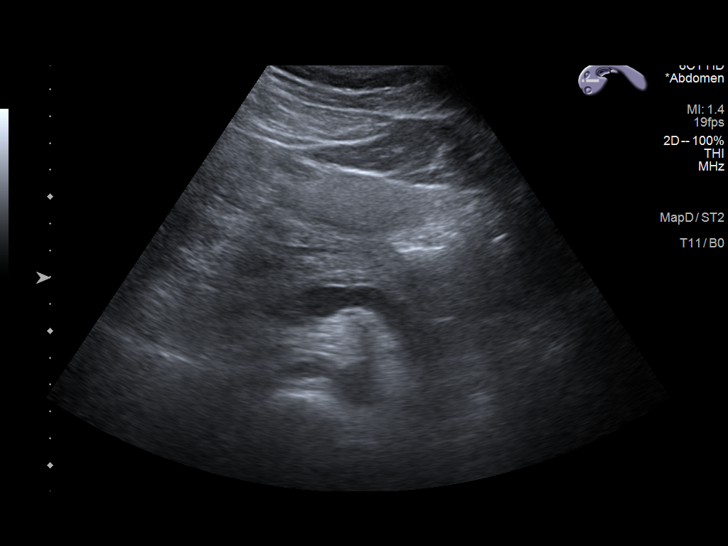
[im 48/82]
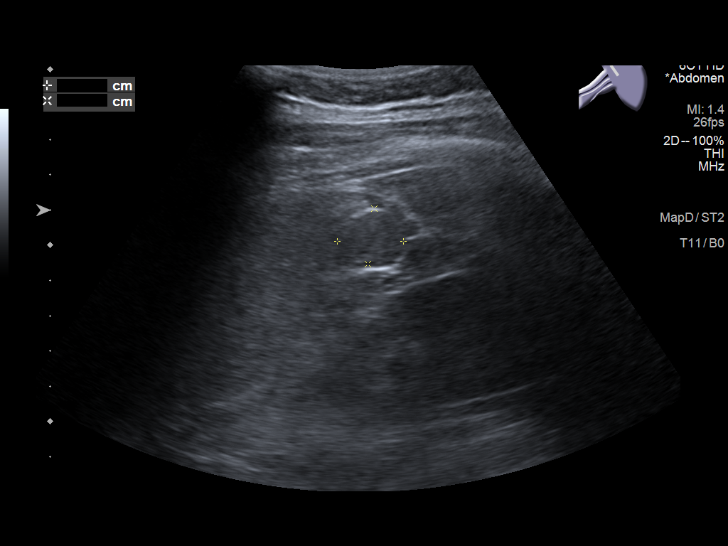
[im 55/82]
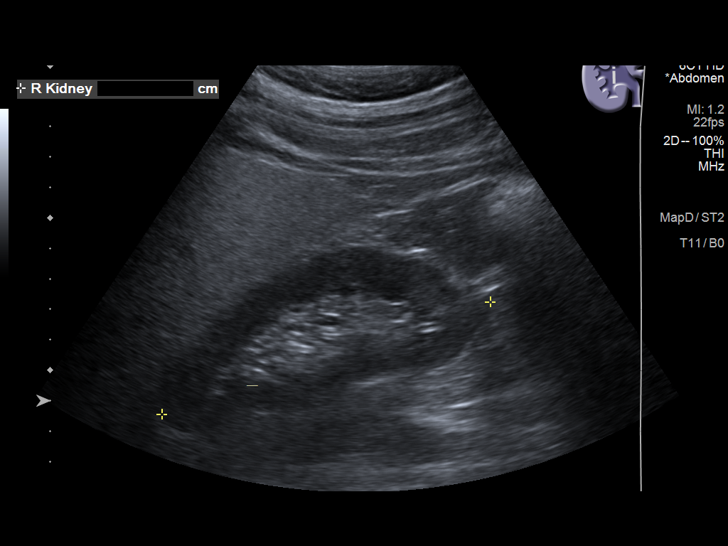
[im 61/82]
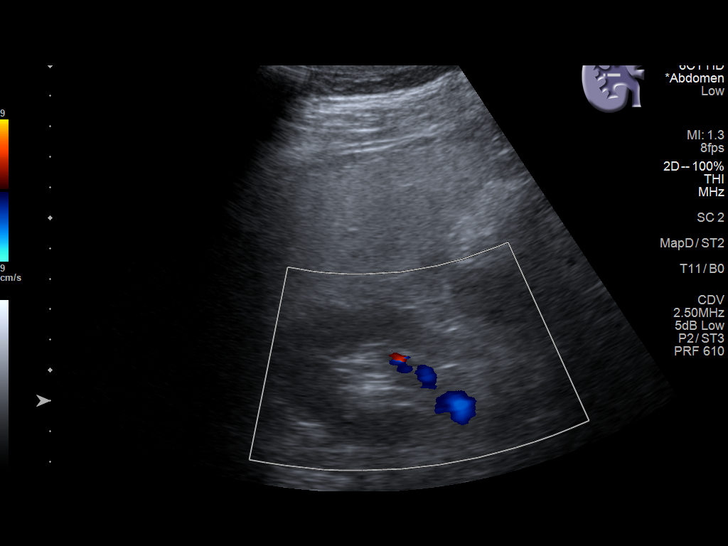
[im 68/82]
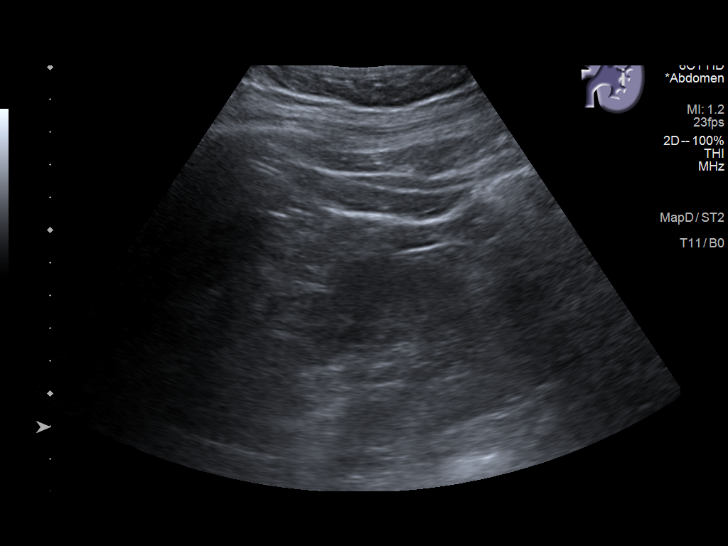
[im 75/82]
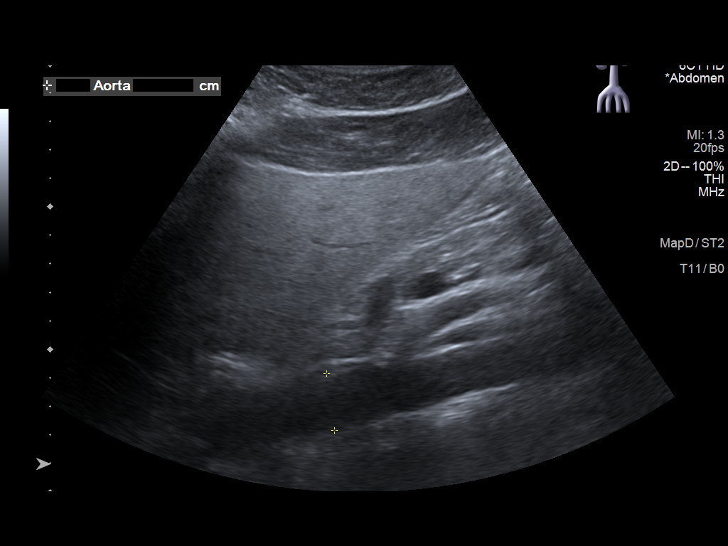
[im 82/82]
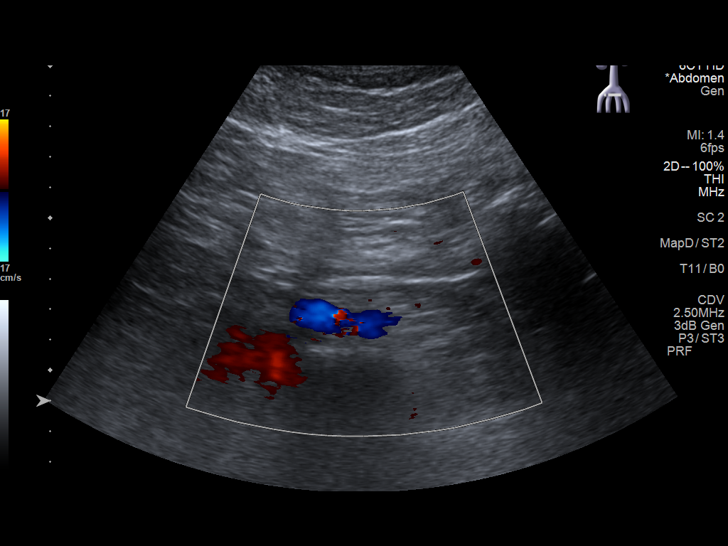

[13 of 25 positions shown; findings below may reference images not displayed]

FINDINGS: Gallbladder: No gallstones or wall thickening visualized. No
sonographic Murphy sign noted by sonographer.

Common bile duct: Diameter: 2 mm, normal

Liver: No focal lesion identified. Diffusely increased hepatic
parenchymal echogenicity. Portal vein is patent on color Doppler
imaging with normal direction of blood flow towards the liver.

IVC: No abnormality visualized.

Pancreas: Visualized portion unremarkable.

Spleen: Size and appearance within the upper limits of normal.

Right Kidney: Length: 11.4 cm. Echogenicity within normal limits. No
mass or hydronephrosis visualized.

Left Kidney: Length: 11.9 cm. Echogenicity within normal limits. No
mass or hydronephrosis visualized.

Abdominal aorta: No aneurysm visualized.

Other findings: There is an indeterminate 19 x 16 x 19 mm masslike
area measured anterior to the LEFT kidney.
IMPRESSION: 1.  Hepatic steatosis.

2. Indeterminate round 19 mm masslike area measured anterior to the
LEFT kidney and inferior to the spleen. While this may reflect a
splenule, no definitive corresponding splenule is noted on prior CT
chest. It may have been out of the field of view. This could be
assessed with dedicated contrast enhanced CT AP if clinically
indicated.

## 2023-03-05 IMAGING — CT CT ABD-PELV W/ CM
2 of 4 series · 16 of 46 positions shown, 18 images · IV contrast (agent unspecified)
Comparison: Abdominal ultrasound 06/27/2021

CLINICAL DATA: Renal mass/cyst, indeterminate. Left renal mass seen
on ultrasound. Elevated LFTs.

EXAM:
CT ABDOMEN AND PELVIS WITH CONTRAST
TECHNIQUE: Multidetector CT imaging of the abdomen and pelvis was performed
using the standard protocol following bolus administration of
intravenous contrast.

[Series 2: abd pelvis 5.00 · axial · 0.86mm/px · z∈[-1549,-1074]mm · 13 of 111 slices shown, 15 images]
[im 8/111  soft-tissue]
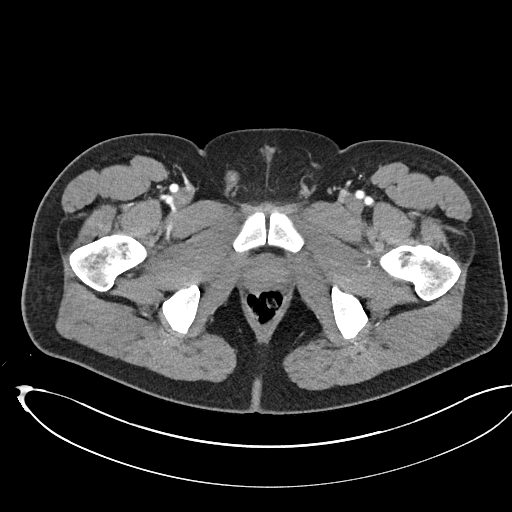
[im 8/111  bone]
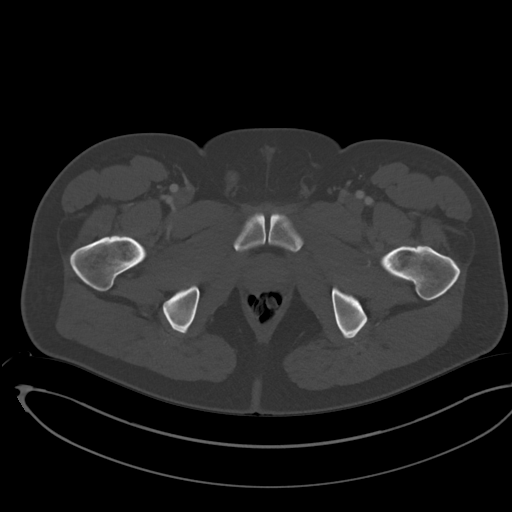
[im 15/111  soft-tissue]
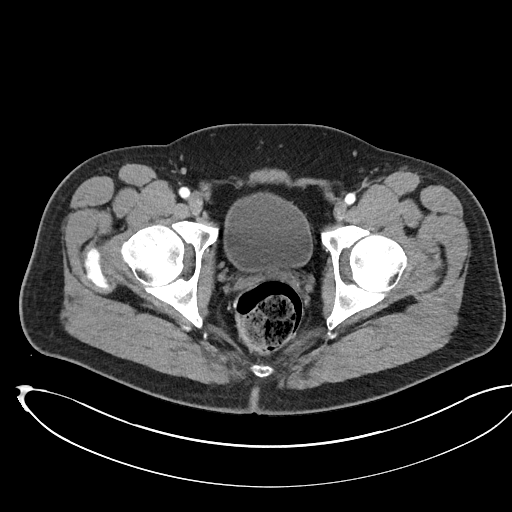
[im 23/111  soft-tissue]
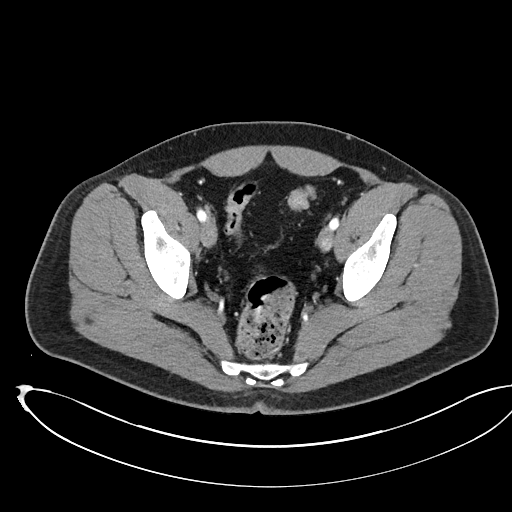
[im 30/111  soft-tissue]
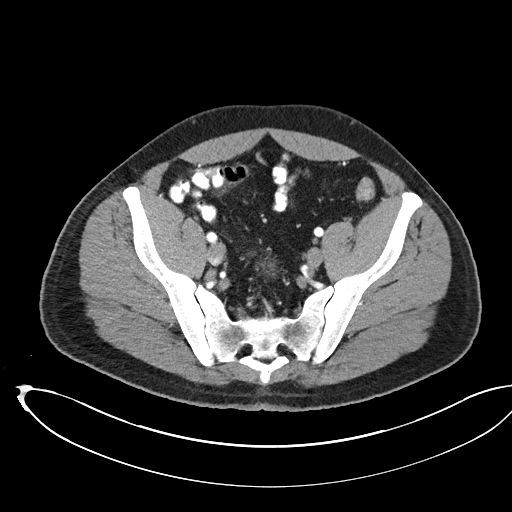
[im 37/111  soft-tissue]
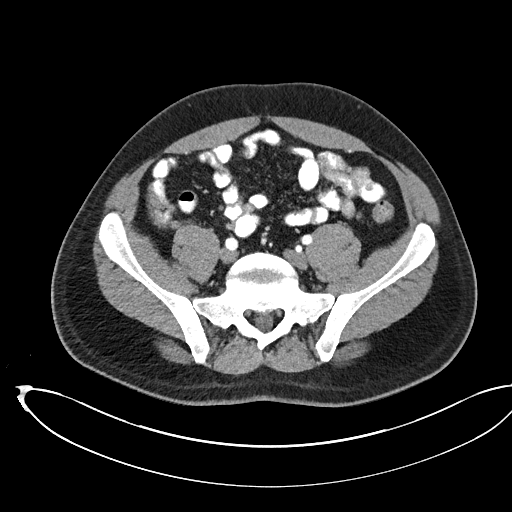
[im 45/111  soft-tissue]
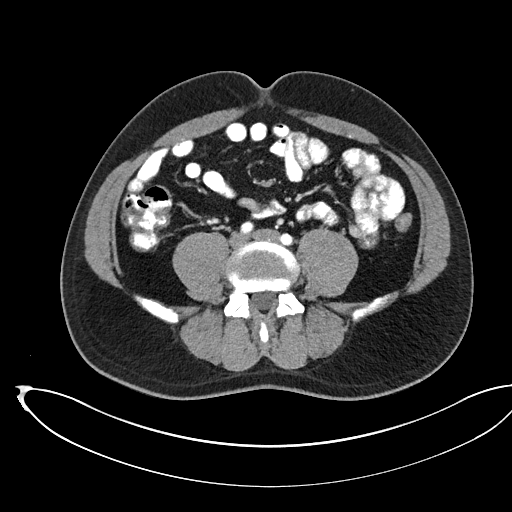
[im 59/111  soft-tissue]
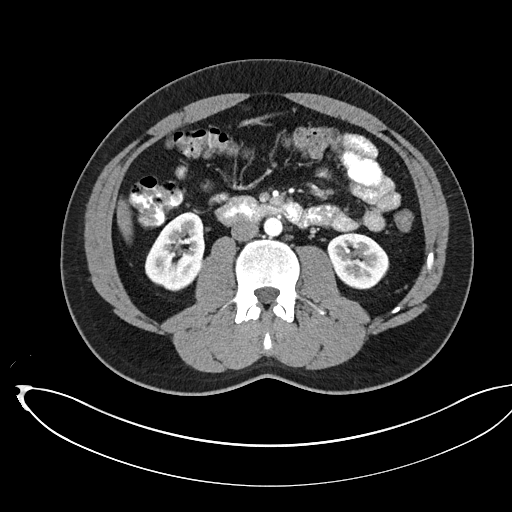
[im 67/111  soft-tissue]
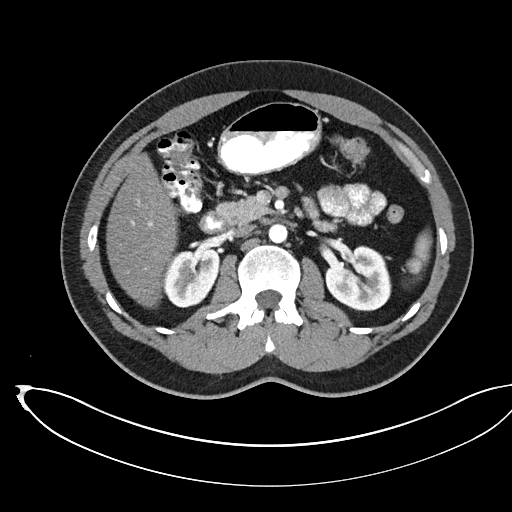
[im 74/111  soft-tissue]
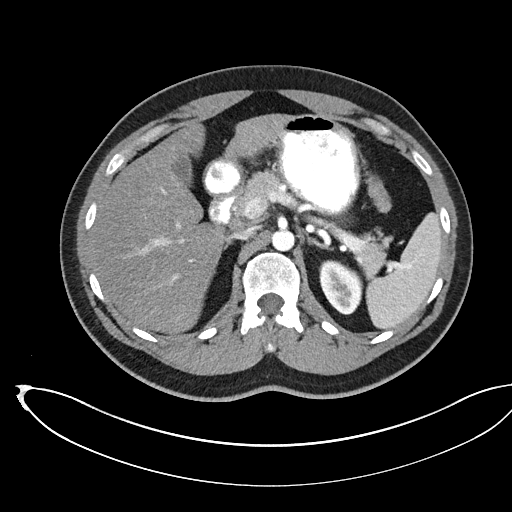
[im 74/111  bone]
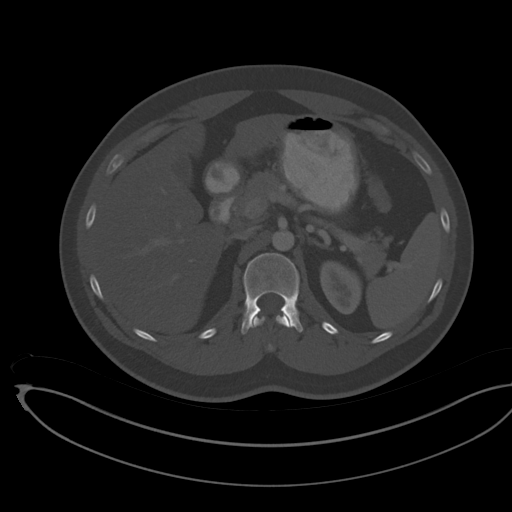
[im 81/111  soft-tissue]
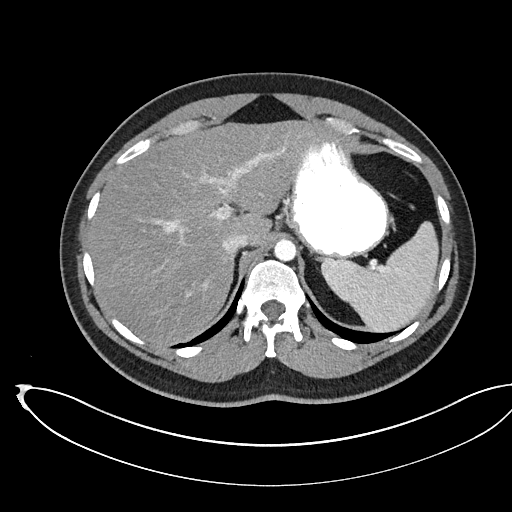
[im 89/111  soft-tissue]
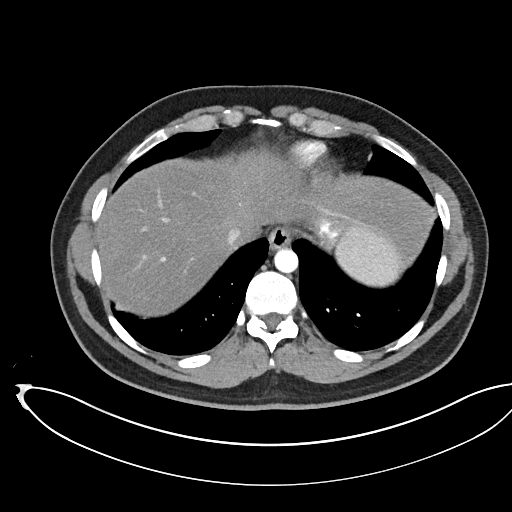
[im 96/111  soft-tissue]
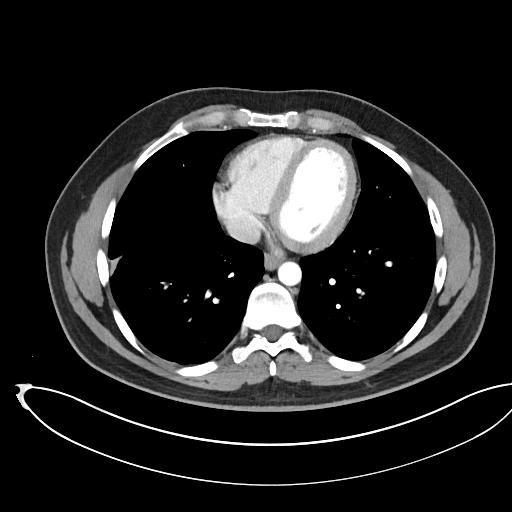
[im 103/111  soft-tissue]
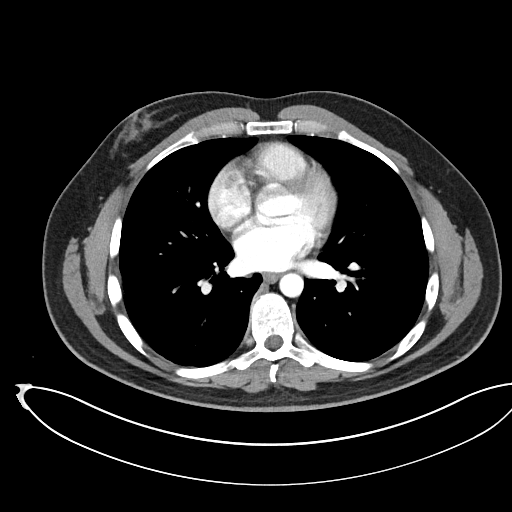

[Series 4: coronals abd pelvis 2.00 cor · coronal · 0.83mm/px · 3 of 155 slices shown]
[im 52/155  soft-tissue]
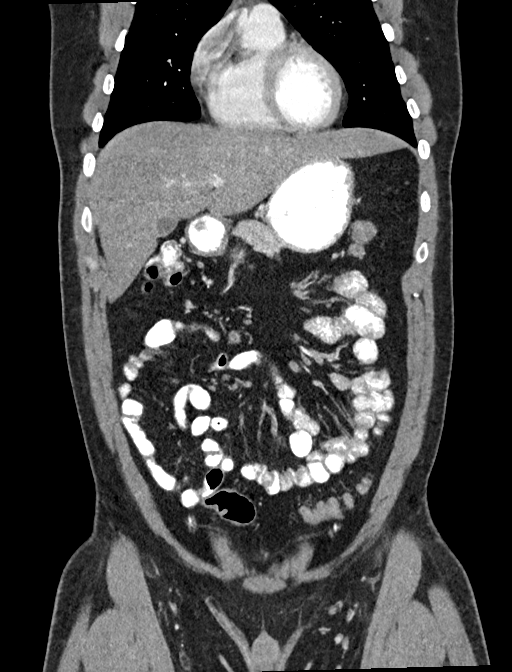
[im 69/155  soft-tissue]
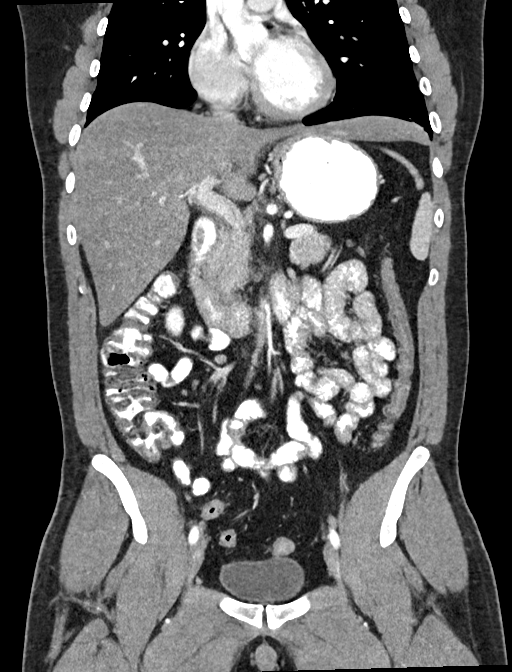
[im 86/155  soft-tissue]
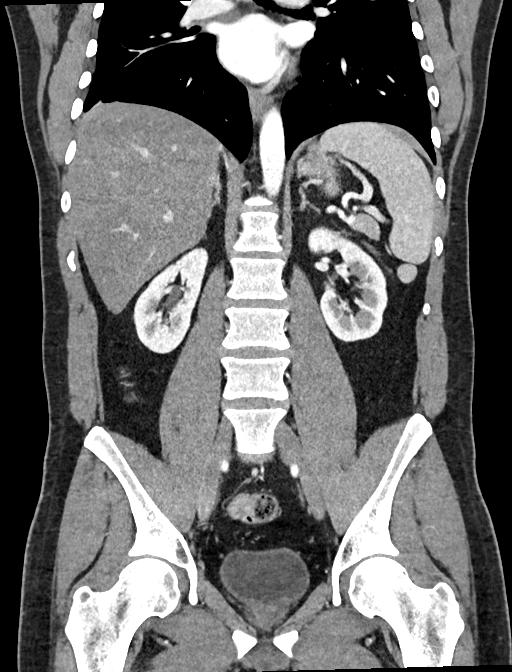

[16 of 46 positions shown; findings below may reference images not displayed]

RADIATION DOSE REDUCTION: This exam was performed according to the
departmental dose-optimization program which includes automated
exposure control, adjustment of the mA and/or kV according to
patient size and/or use of iterative reconstruction technique.

CONTRAST:  100mL OMNIPAQUE IOHEXOL 300 MG/ML  SOLN
FINDINGS: Lower chest: No acute abnormality.

Hepatobiliary: Diffuse low-density throughout the liver compatible
with fatty infiltration. No focal abnormality. Gallbladder
unremarkable.

Pancreas: No focal abnormality or ductal dilatation.

Spleen: No focal abnormality. Normal size. 1.8 cm splenule noted
inferior to the spleen and anterior to the left kidney,
corresponding to the solid nodule seen on prior ultrasound.

Adrenals/Urinary Tract: No adrenal abnormality. No focal renal
abnormality. No stones or hydronephrosis. Urinary bladder is
unremarkable.

Stomach/Bowel: Stomach, large and small bowel grossly unremarkable.
Normal appendix.

Vascular/Lymphatic: No evidence of aneurysm or adenopathy.

Reproductive: No visible focal abnormality.

Other: No free fluid or free air.

Musculoskeletal: No acute bony abnormality.
IMPRESSION: Previously seen nodule between the spleen and left kidney on
ultrasound reflects an accessory spleen/splenule. No suspicious
splenic or left renal mass noted.

Hepatic steatosis.

## 2023-05-14 ENCOUNTER — Encounter: Payer: BC Managed Care – PPO | Admitting: Nurse Practitioner

## 2023-07-20 DIAGNOSIS — L309 Dermatitis, unspecified: Secondary | ICD-10-CM | POA: Diagnosis not present

## 2024-02-01 NOTE — Progress Notes (Signed)
    Assessment & Plan:  1. History of pulmonary embolus (PE) (Primary)  2. Brugada syndrome   Patient Instructions  Continue Eliquis  for now.  Follow-up in 2 to 3 months.  Please note: late entry documentation due to logistical difficulties during COVID-19 pandemic. This note is filed for information purposes only, and is not intended to be used for billing, nor does it represent the full scope/nature of the visit in question. Please see any associated scanned media linked to date of encounter for additional pertinent information.  Subjective:    HPI: Philip Houston is a 33 y.o. male presenting to the pulmonology clinic on 11/21/2019 with report of: Follow-up (Doing well and no new co's. )     Outpatient Encounter Medications as of 11/21/2019  Medication Sig   [DISCONTINUED] acetaminophen  (TYLENOL ) 325 MG tablet Take 2 tablets (650 mg total) by mouth every 6 (six) hours as needed for mild pain or fever. (Patient not taking: Reported on 01/05/2020)   [DISCONTINUED] albuterol  (VENTOLIN  HFA) 108 (90 Base) MCG/ACT inhaler Inhale 2 puffs into the lungs every 4 (four) hours as needed for wheezing or shortness of breath. (Patient not taking: Reported on 01/05/2020)   [DISCONTINUED] apixaban  (ELIQUIS ) 5 MG TABS tablet Take 1 tablet (5 mg total) by mouth 2 (two) times daily.   [DISCONTINUED] oxyCODONE -acetaminophen  (PERCOCET/ROXICET) 5-325 MG tablet Take 1 tablet by mouth every 4 (four) hours as needed for moderate pain. (Patient not taking: Reported on 01/05/2020)   [DISCONTINUED] triamcinolone  cream (KENALOG ) 0.1 % Apply 1 application topically 2 (two) times daily. exezema  (Patient not taking: Reported on 04/09/2021)   No facility-administered encounter medications on file as of 11/21/2019.      Objective:   Vitals:   11/21/19 1454  BP: 114/80  Pulse: 64  Temp: 98.1 F (36.7 C)  Height: 6' (1.829 m)  Weight: (!) 211 lb 3.2 oz (95.8 kg)  SpO2: 98% Comment: on RA  TempSrc: Oral  BMI  (Calculated): 28.64     Physical exam documentation is limited by delayed entry of information.
